# Patient Record
Sex: Female | Born: 1990 | Race: White | Hispanic: No | Marital: Married | Smoking: Never smoker
Health system: Southern US, Community
[De-identification: ages and names within clinical notes are randomized; demographics above are authoritative.]

## PROBLEM LIST (undated history)

## (undated) DIAGNOSIS — N390 Urinary tract infection, site not specified: Secondary | ICD-10-CM

## (undated) HISTORY — PX: WISDOM TOOTH EXTRACTION: SHX21

## (undated) HISTORY — DX: Urinary tract infection, site not specified: N39.0

---

## 2014-02-10 ENCOUNTER — Ambulatory Visit: Payer: Self-pay | Admitting: Family Medicine

## 2014-02-19 ENCOUNTER — Ambulatory Visit (INDEPENDENT_AMBULATORY_CARE_PROVIDER_SITE_OTHER): Payer: 59 | Admitting: Family Medicine

## 2014-02-19 ENCOUNTER — Encounter: Payer: Self-pay | Admitting: Family Medicine

## 2014-02-19 VITALS — BP 121/83 | HR 71 | Temp 98.3°F | Ht 69.0 in | Wt 130.0 lb

## 2014-02-19 DIAGNOSIS — Z30011 Encounter for initial prescription of contraceptive pills: Secondary | ICD-10-CM

## 2014-02-19 DIAGNOSIS — Z309 Encounter for contraceptive management, unspecified: Secondary | ICD-10-CM | POA: Insufficient documentation

## 2014-02-19 DIAGNOSIS — Z3009 Encounter for other general counseling and advice on contraception: Secondary | ICD-10-CM

## 2014-02-19 MED ORDER — NORGESTIMATE-ETH ESTRADIOL 0.25-35 MG-MCG PO TABS: 1.0000 | ORAL_TABLET | Freq: Every day | ORAL | Status: DC

## 2014-02-19 NOTE — Progress Notes (Signed)
   Subjective:    Patient ID: Joy Nichols, female    DOB: 07/12/1991, 23 y.o.   MRN: [id number]  HPI Joy Nichols is a 23 y.o. female presents to [facility] for establishment of care.   Pt has no complaints today. 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 She went to school in [place] and is from [place].  Well women: Patient's last menstrual period was 01/29/2014.  her menstrual cycles are approximately every 5 weeks. They last 4 days, with one day being heavy. She currently is not on any birth control methods but would like to restart the pill. She has taken Sprintec in the past. She's not currently sexually active. She prefers female partners. No family history of breast or colon cancer. Last Pap in 2013, Pap was normal.   Health maintenance: Last Pap 2013, it was normal. Flu shot given in 2014. Tdap. Given in 2008. Hepatitis B series in 2003. Hepatitis B titer and 2013 was positive. History of chickenpox in 79.Varicella titer in 2013 positive. MMR in [place].  PPDs have been negative up to this year. Menactra given in 2007.   Social: Patient is a gravida 0 para 0. 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 She is single. She exercises regularly at least 3 times a week. She denies tobacco, recreational drug use or overuse of alcohol. She does drink socially. She is safe in her relationships at home. She has no depression risks.  Past medical history of frequent UTIs. Nonsmoker. Family history updated. No known allergies.  Review of Systems Per HPI    Objective:   Physical Exam BP 121/83  Pulse 71  Temp(Src) 98.3 F (36.8 C) (Oral)  Ht $R'5\' 9"'LN$  (1.753 m)  Wt 130 lb (58.968 kg)  BMI 19.19 kg/m2  LMP 01/29/2014 Gen: extremely pleasant, Caucasian thin female. Well-developed, well-nourished, no acute distress, nontoxic in appearance HEENT: AT. [place]. Bilateral TM visualized and normal in appearance. Bilateral eyes without injections or icterus. MMM.  Bilateral nares  without erythema or swelling. Throat without erythema or exudates.  CV: RRR , no murmur appreciated. Chest: CTAB, no wheeze or crackles Abd: Soft.  thin. NTND. BS  present. no Masses palpated.  Ext: No erythema.  No edema.  2/4 posterior tibialis pulse and radial pulse. Skin:  no rashes, purpura or petechiae.  Neuro:  Normal gait. PERLA. EOMi. Alert.  cranial nerves II through XII grossly intact. DTRs equal bilaterally. Psych:  normal affect, dress, demeanor and mood. Normal speech.        Assessment & Plan:

## 2014-02-19 NOTE — Patient Instructions (Signed)
It was a pleasure meeting you today. Please make an appointment with her to have your Pap completed. If you need anything do not hesitate to call to make an appointment  Pap Test A Pap test is a procedure done in a clinic office to evaluate cells that are on the surface of the cervix. The cervix is the lower portion of the uterus and upper portion of the vagina. For some women, the cervical region has the potential to form cancer. With consistent evaluations by your caregiver, this type of cancer can be prevented.  If a Pap test is abnormal, it is most often a result of a previous exposure to human papillomavirus (HPV). HPV is a virus that can infect the cells of the cervix and cause dysplasia. Dysplasia is where the cells no longer look normal. If a woman has been diagnosed with high-grade or severe dysplasia, they are at higher risk of developing cervical cancer. People diagnosed with low-grade dysplasia should still be seen by their caregiver because there is a small chance that low-grade dysplasia could develop into cancer.  LET YOUR CAREGIVER KNOW ABOUT:  Recent sexually transmitted infection (STI) you have had.  Any new sex partners you have had.  History of previous abnormal Pap tests results.  History of previous cervical procedures you have had (colposcopy, biopsy, loop electrosurgical excision procedure [LEEP]).  Concerns you have had regarding unusual vaginal discharge.  History of pelvic pain.  Your use of birth control. BEFORE THE PROCEDURE  Ask your caregiver when to schedule your Pap test. It is best not to be on your period if your caregiver uses a wooden spatula to collect cells or applies cells to a glass slide. Newer techniques are not so sensitive to the timing of a menstrual cycle.  Do not douche or have sexual intercourse for 24 hours before the test.   Do not use vaginal creams or tampons for 24 hours before the test.   Empty your bladder just before the test to  lessen any discomfort.  PROCEDURE You will lie on an exam table with your feet in stirrups. A warm metal or plastic instrument (speculum) is placed in your vagina. This instrument allows your caregiver to see the inside of your vagina and look at your cervix. A small, plastic brush or wooden spatula is then used to collect cervical cells. These cells are placed in a lab specimen container. The cells are looked at under a microscope. A specialist will determine if the cells are normal.  AFTER THE PROCEDURE Make sure to get your test results.If your results come back abnormal, you may need further testing.  Document Released: 10/01/2002 Document Revised: 10/03/2011 Document Reviewed: 07/07/2011 [facility] Patient Information 2015 [employer], [place]. This information is not intended to replace advice given to you by your health care provider. Make sure you discuss any questions you have with your health care provider.

## 2014-04-07 ENCOUNTER — Ambulatory Visit: Payer: 59 | Admitting: Family Medicine

## 2015-01-12 ENCOUNTER — Ambulatory Visit (INDEPENDENT_AMBULATORY_CARE_PROVIDER_SITE_OTHER): Payer: 59 | Admitting: Internal Medicine

## 2015-01-12 DIAGNOSIS — Z23 Encounter for immunization: Secondary | ICD-10-CM

## 2015-01-12 DIAGNOSIS — Z9189 Other specified personal risk factors, not elsewhere classified: Secondary | ICD-10-CM | POA: Diagnosis not present

## 2015-01-12 MED ORDER — CIPROFLOXACIN HCL 500 MG PO TABS: 500.0000 mg | ORAL_TABLET | Freq: Two times a day (BID) | ORAL | Status: DC

## 2015-01-12 NOTE — Progress Notes (Signed)
  RFV: 2 wk in [place] and 1 wk in [place] Subjective:    Patient ID: Joy Nichols, female    DOB: 16-May-1991, 24 y.o.   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 She had flu vaccine in the last year, as well as pre-travel vaccination in may 2014 for [place] trip which inc hep a, typhoid inj. She states that she had tetanus roughly 8 yr ago.uptodate on vaccines for work  Current Outpatient Prescriptions on File Prior to Visit  Medication Sig Dispense Refill  . norgestimate-ethinyl estradiol (SPRINTEC 28) 0.25-35 MG-MCG tablet Take 1 tablet by mouth daily. 1 Package 11   No current facility-administered medications on file prior to visit.   No Known Allergies  Past travel: [place], [place], [place], [place], [place], [place], [place], [place], [place], [place], [place], [place], [place], [place], [place]  Review of Systems     Objective:   Physical Exam        Assessment & Plan:  Pre travel advice on traveler's diarrhea and mosquito bite prevention  Traveler diarrhea = gave rx for cipro  Travel vaccinations = will give hep A #2 today, recommend that she get typhoid next year

## 2015-03-05 ENCOUNTER — Other Ambulatory Visit: Payer: Self-pay | Admitting: *Deleted

## 2015-03-05 DIAGNOSIS — Z30011 Encounter for initial prescription of contraceptive pills: Secondary | ICD-10-CM

## 2015-03-05 MED ORDER — NORGESTIMATE-ETH ESTRADIOL 0.25-35 MG-MCG PO TABS: 1.0000 | ORAL_TABLET | Freq: Every day | ORAL | Status: DC

## 2015-04-12 ENCOUNTER — Telehealth: Payer: 59 | Admitting: Family

## 2015-04-12 DIAGNOSIS — N3000 Acute cystitis without hematuria: Secondary | ICD-10-CM

## 2015-04-12 MED ORDER — NITROFURANTOIN MONOHYD MACRO 100 MG PO CAPS: 100.0000 mg | ORAL_CAPSULE | Freq: Two times a day (BID) | ORAL | Status: DC

## 2015-04-12 NOTE — Progress Notes (Signed)

## 2015-04-29 ENCOUNTER — Encounter: Payer: Self-pay | Admitting: Family Medicine

## 2015-04-29 ENCOUNTER — Ambulatory Visit (INDEPENDENT_AMBULATORY_CARE_PROVIDER_SITE_OTHER): Payer: 59 | Admitting: Family Medicine

## 2015-04-29 VITALS — BP 128/85 | HR 70 | Temp 98.1°F | Ht 69.0 in | Wt 127.0 lb

## 2015-04-29 DIAGNOSIS — R5383 Other fatigue: Secondary | ICD-10-CM | POA: Diagnosis not present

## 2015-04-29 DIAGNOSIS — R6889 Other general symptoms and signs: Secondary | ICD-10-CM

## 2015-04-29 DIAGNOSIS — Z0001 Encounter for general adult medical examination with abnormal findings: Secondary | ICD-10-CM | POA: Diagnosis not present

## 2015-04-29 DIAGNOSIS — Z0000 Encounter for general adult medical examination without abnormal findings: Secondary | ICD-10-CM | POA: Diagnosis not present

## 2015-04-29 DIAGNOSIS — Z8342 Family history of familial hypercholesterolemia: Secondary | ICD-10-CM | POA: Diagnosis not present

## 2015-04-29 LAB — LIPID PANEL
CHOL/HDL RATIO: 2.9 ratio (ref <=5.0)
CHOLESTEROL: 168 mg/dL (ref 125–200)
HDL: 58 mg/dL (ref >=46)
LDL Cholesterol: 88 mg/dL (ref <130)
Triglycerides: 110 mg/dL (ref <150)
VLDL: 22 mg/dL (ref <30)

## 2015-04-29 LAB — CBC
HCT: 38.1 % (ref 36.0–46.0)
Hemoglobin: 13.3 g/dL (ref 12.0–15.0)
MCH: 32.4 pg (ref 26.0–34.0)
MCHC: 34.9 g/dL (ref 30.0–36.0)
MCV: 92.7 fL (ref 78.0–100.0)
MPV: 9 fL (ref 8.6–12.4)
PLATELETS: 241 10*3/uL (ref 150–400)
RBC: 4.11 MIL/uL (ref 3.87–5.11)
RDW: 14 % (ref 11.5–15.5)
WBC: 3.9 10*3/uL — ABNORMAL LOW (ref 4.0–10.5)

## 2015-04-29 LAB — BASIC METABOLIC PANEL WITH GFR
BUN: 12 mg/dL (ref 7–25)
CHLORIDE: 107 mmol/L (ref 98–110)
CO2: 25 mmol/L (ref 20–31)
Calcium: 9.4 mg/dL (ref 8.6–10.2)
Creat: 0.72 mg/dL (ref 0.50–1.10)
GFR, Est Non African American: >89 mL/min (ref >=60)
Glucose, Bld: 90 mg/dL (ref 65–99)
POTASSIUM: 4.1 mmol/L (ref 3.5–5.3)
SODIUM: 139 mmol/L (ref 135–146)

## 2015-04-29 LAB — TSH: TSH: 2.689 u[IU]/mL (ref 0.350–4.500)

## 2015-04-29 NOTE — Assessment & Plan Note (Signed)
Due for pap, has OBGYN appointment for this next week. Already had flu shot. Family history of high cholesterol. Check lipid panel today.

## 2015-04-29 NOTE — Assessment & Plan Note (Signed)
Vague ongoing symptoms, will check BMP, CBC, TSH, Vit D.

## 2015-04-29 NOTE — Progress Notes (Signed)
   Subjective:    Patient ID: Joy Nichols, female    DOB: 30-Jun-1991, 24 y.o.   MRN: [id number]  HPI  CC: annual physical  # Annual physical:  Due for pap smear, has OBGYN appointment next week for this  Got flu shot last week ([employer] employee)  Family history of high cholesterol ROS: no heartburn, no HA, +UTIs in the past treated with antibiotics.   # Fatigue  Noticed more tired lately ROS: no weight changes, +cold insensitivity, +easy bruisability  Social Hx: never smoker, drinks socially about once a week (no binge drinking)  Review of Systems   See HPI for ROS.   Past medical history, surgical, family, and social history reviewed and updated in the EMR as appropriate. Objective:  BP 128/85 mmHg  Pulse 70  Temp(Src) 98.1 F (36.7 C) (Oral)  Ht  (1.753 m)  Wt 127 lb (57.607 kg)  BMI 18.75 kg/m2  LMP 04/27/2015 (Exact Date) Vitals and nursing note reviewed  General: NAD HEENT: PERRL, EOMI. Normal nares. No oropharyngeal lesions Neck: [place] CV: RRR, normal s1s2, no murmurs/rub/gallop, 2+ PT pulses bilaterally Resp: clear to auscultation bilaterally, normal effort Abdomen: thin, soft, nontender, nondistended, normal bowel sounds Ext: no edema. Normal muscle bulk/tone. Neuro: alert and oriented, no focal deficits. Normal gait Skin: scattered bruises noted on lower legs. Psych: mood normal, affect congruent. Normal thought content and speech.  Assessment & Plan:  Healthcare maintenance Due for pap, has OBGYN appointment for this next week. Already had flu shot. Family history of high cholesterol. Check lipid panel today.  Fatigue Vague ongoing symptoms, will check BMP, CBC, TSH, Vit D.

## 2015-04-30 ENCOUNTER — Encounter: Payer: Self-pay | Admitting: Family Medicine

## 2015-04-30 LAB — VITAMIN D 25 HYDROXY (VIT D DEFICIENCY, FRACTURES): Vit D, 25-Hydroxy: 39 ng/mL (ref 30–100)

## 2015-09-29 DIAGNOSIS — R300 Dysuria: Secondary | ICD-10-CM | POA: Diagnosis not present

## 2015-10-05 DIAGNOSIS — H5213 Myopia, bilateral: Secondary | ICD-10-CM | POA: Diagnosis not present

## 2016-01-19 ENCOUNTER — Other Ambulatory Visit: Payer: Self-pay | Admitting: *Deleted

## 2016-01-19 DIAGNOSIS — Z30011 Encounter for initial prescription of contraceptive pills: Secondary | ICD-10-CM

## 2016-01-19 MED ORDER — NORGESTIMATE-ETH ESTRADIOL 0.25-35 MG-MCG PO TABS: 1.0000 | ORAL_TABLET | Freq: Every day | ORAL | Status: DC

## 2016-08-17 DIAGNOSIS — Z0000 Encounter for general adult medical examination without abnormal findings: Secondary | ICD-10-CM | POA: Diagnosis not present

## 2016-08-17 DIAGNOSIS — Z01419 Encounter for gynecological examination (general) (routine) without abnormal findings: Secondary | ICD-10-CM | POA: Diagnosis not present

## 2016-08-17 DIAGNOSIS — Z13220 Encounter for screening for lipoid disorders: Secondary | ICD-10-CM | POA: Diagnosis not present

## 2016-08-17 DIAGNOSIS — Z1329 Encounter for screening for other suspected endocrine disorder: Secondary | ICD-10-CM | POA: Diagnosis not present

## 2016-08-17 DIAGNOSIS — Z681 Body mass index (BMI) 19 or less, adult: Secondary | ICD-10-CM | POA: Diagnosis not present

## 2016-08-17 DIAGNOSIS — Z130 Encounter for screening for diseases of the blood and blood-forming organs and certain disorders involving the immune mechanism: Secondary | ICD-10-CM | POA: Diagnosis not present

## 2016-10-05 DIAGNOSIS — H5213 Myopia, bilateral: Secondary | ICD-10-CM | POA: Diagnosis not present

## 2016-10-28 ENCOUNTER — Ambulatory Visit (INDEPENDENT_AMBULATORY_CARE_PROVIDER_SITE_OTHER): Payer: 59 | Admitting: Family Medicine

## 2016-10-28 ENCOUNTER — Encounter: Payer: Self-pay | Admitting: Family Medicine

## 2016-10-28 VITALS — BP 110/78 | HR 74 | Temp 97.9°F | Ht 69.0 in | Wt 122.0 lb

## 2016-10-28 DIAGNOSIS — J020 Streptococcal pharyngitis: Secondary | ICD-10-CM

## 2016-10-28 DIAGNOSIS — J029 Acute pharyngitis, unspecified: Secondary | ICD-10-CM

## 2016-10-28 LAB — POCT RAPID STREP A (OFFICE): RAPID STREP A SCREEN: POSITIVE — AB

## 2016-10-28 MED ORDER — PENICILLIN G BENZATHINE 1200000 UNIT/2ML IM SUSP
1.2000 10*6.[IU] | Freq: Once | INTRAMUSCULAR | Status: AC
  Administered 2016-10-28: 1.2 10*6.[IU] via INTRAMUSCULAR

## 2016-10-28 MED ORDER — AMOXICILLIN 500 MG PO CAPS: 500.0000 mg | ORAL_CAPSULE | Freq: Three times a day (TID) | ORAL | 0 refills | Status: DC

## 2016-10-28 NOTE — Patient Instructions (Signed)
You were given an injection of penicillin for the strep throat.  Continue warm fluids and lozenges as needed.  If you note worsening symptoms such as difficulty handling secretions, worsening pain with swallowing, or high fevers, please seek care immediately.   Strep Throat Strep throat is a bacterial infection of the throat. Your health care provider may call the infection tonsillitis or pharyngitis, depending on whether there is swelling in the tonsils or at the back of the throat. Strep throat is most common during the cold months of the year in children who are 11-7 years of age, but it can happen during any season in people of any age. This infection is spread from person to person (contagious) through coughing, sneezing, or close contact. What are the causes? Strep throat is caused by the bacteria called Streptococcus pyogenes. What increases the risk? This condition is more likely to develop in:  People who spend time in crowded places where the infection can spread easily.  People who have close contact with someone who has strep throat. What are the signs or symptoms? Symptoms of this condition include:  Fever or chills.  Redness, swelling, or pain in the tonsils or throat.  Pain or difficulty when swallowing.  White or yellow spots on the tonsils or throat.  Swollen, tender glands in the neck or under the jaw.  Red rash all over the body (rare). How is this diagnosed? This condition is diagnosed by performing a rapid strep test or by taking a swab of your throat (throat culture test). Results from a rapid strep test are usually ready in a few minutes, but throat culture test results are available after one or two days. How is this treated? This condition is treated with antibiotic medicine. Follow these instructions at home: Medicines   Take over-the-counter and prescription medicines only as told by your health care provider.  Take your antibiotic as told by your  health care provider. Do not stop taking the antibiotic even if you start to feel better.  Have family members who also have a sore throat or fever tested for strep throat. They may need antibiotics if they have the strep infection. Eating and drinking   Do not share food, drinking cups, or personal items that could cause the infection to spread to other people.  If swallowing is difficult, try eating soft foods until your sore throat feels better.  Drink enough fluid to keep your urine clear or pale yellow. General instructions   Gargle with a salt-water mixture 3-4 times per day or as needed. To make a salt-water mixture, completely dissolve -1 tsp of salt in 1 cup of warm water.  Make sure that all household members wash their hands well.  Get plenty of rest.  Stay home from school or work until you have been taking antibiotics for 24 hours.  Keep all follow-up visits as told by your health care provider. This is important. Contact a health care provider if:  The glands in your neck continue to get bigger.  You develop a rash, cough, or earache.  You cough up a thick liquid that is green, yellow-brown, or bloody.  You have pain or discomfort that does not get better with medicine.  Your problems seem to be getting worse rather than better.  You have a fever. Get help right away if:  You have new symptoms, such as vomiting, severe headache, stiff or painful neck, chest pain, or shortness of breath.  You have severe throat  pain, drooling, or changes in your voice.  You have swelling of the neck, or the skin on the neck becomes red and tender.  You have signs of dehydration, such as fatigue, dry mouth, and decreased urination.  You become increasingly sleepy, or you cannot wake up completely.  Your joints become red or painful. This information is not intended to replace advice given to you by your health care provider. Make sure you discuss any questions you have with  your health care provider. Document Released: 07/08/2000 Document Revised: 03/09/2016 Document Reviewed: 11/03/2014 Elsevier Interactive Patient Education  2017 [employer].

## 2016-10-28 NOTE — Progress Notes (Signed)
Subjective: CC: sore throat HPI: Patient is a 26 y.o. female presenting to clinic today for sore throat.   Sore throat started yesterday. She had significant pain with swallowing, but it improved with warm tea. She noted some white spots on heart tonsils last night. She also noted some body aches last night that resolved this morning.  She states that this morning when she was her tonsils, she noted what appeared to be a "blood blister" on her right tonsil. She is drinking better today, she notes that her sore throat improves with warm liquids. Her sore throat is worsened during the evening time. She denies any difficulty with handling her secretions. She denies fever or chills. No cough, nasal congestion, rhinorrhea, rash, otalgias, known sick contacts.  As the patient is a going to [place] in 2 days.  Social History: pharmacist at the hospital   ROS: All other systems reviewed and are negative.  Past Medical History Patient Active Problem List   Diagnosis Date Noted  . Strep pharyngitis 10/28/2016  . Healthcare maintenance 04/29/2015  . Fatigue 04/29/2015  . Contraception management 02/19/2014    Medications- reviewed and updated Current Outpatient Prescriptions  Medication Sig Dispense Refill  . amoxicillin (AMOXIL) 500 MG capsule Take 1 capsule (500 mg total) by mouth 3 (three) times daily. 20 capsule 0  . ciprofloxacin (CIPRO) 500 MG tablet Take 1 tablet (500 mg total) by mouth 2 (two) times daily. If you have 3 loose stools / 24hr. Can stop taking if diarrhea resolves 10 tablet 0  . nitrofurantoin, macrocrystal-monohydrate, (MACROBID) 100 MG capsule Take 1 capsule (100 mg total) by mouth 2 (two) times daily. 10 capsule 0  . norgestimate-ethinyl estradiol (SPRINTEC 28) 0.25-35 MG-MCG tablet Take 1 tablet by mouth daily. 1 Package 11   No current facility-administered medications for this visit.     Objective: Office vital signs reviewed. BP 110/78   Pulse 74   Temp 97.9  F (36.6 C) (Oral)   Ht  (1.753 m)   Wt 122 lb (55.3 kg)   LMP 10/07/2016   SpO2 99%   BMI 18.02 kg/m    Physical Examination:  General: Awake, alert, well- nourished, NAD, Pleasant ENMT:  TMs intact, normal light reflex, no erythema, no bulging. Nasal turbinates moist. MMM, Swelling and erythema noted of the tonsils, R>L, bright red area on right tonsil with ulceration noted. Uvula midline. No evidence of abscess.  Eyes: Conjunctiva non-injected. PERRL.  Cardio: RRR, no m/r/g noted. No thrill Pulm: No increased WOB.  CTAB, without wheezes, rhonchi or crackles noted.   Rapid Strep A positive   Assessment/Plan: Strep pharyngitis Exam and history consistent with strep pharyngitis. Rapid strep A positive. Patient is nontoxic on exam. She is noted to have an ulceration on her right tonsil, but no evidence of A retropharyngeal abscess. Additionally, no difficulty handling secretions. -Given Bicillin 1.2 million units IM in clinic today -Patient states that her parents noted as a child she often had to be re-treated for strep and she's concerned as she's going to [place] in 2 days. She requests a Rx that she can fill if she needs it in [place].  She seems very appropriate (pharmacist), therefore I gave her a Rx for amoxicillin  BID x 10 days to fill just in case. We discuss that PCN takes some time to be effective. Also discuss reasons she needs to seek care immediately such as worsening pain, difficulty swallowing, difficulty handling secretions.  - Should continue to  f/u patient to ensure ulceration on R tonsils resolves with resolution of infection    Orders Placed This Encounter  Procedures  . POCT rapid strep A    Meds ordered this encounter  Medications  . penicillin g benzathine (BICILLIN LA) 1200000 UNIT/2ML injection 1.2 Million Units  . amoxicillin (AMOXIL) 500 MG capsule    Sig: Take 1 capsule (500 mg total) by mouth 3 (three) times daily.    Dispense:  20 capsule     Refill:  0    Joanna Puff PGY-3, [facility]

## 2016-10-28 NOTE — Assessment & Plan Note (Addendum)
Exam and history consistent with strep pharyngitis. Rapid strep A positive. Patient is nontoxic on exam. She is noted to have an ulceration on her right tonsil, but no evidence of A retropharyngeal abscess. Additionally, no difficulty handling secretions. -Given Bicillin 1.2 million units IM in clinic today -Patient states that her parents noted as a child she often had to be re-treated for strep and she's concerned as she's going to [place] in 2 days. She requests a Rx that she can fill if she needs it in [place].  She seems very appropriate (pharmacist), therefore I gave her a Rx for amoxicillin  BID x 10 days to fill just in case. We discuss that PCN takes some time to be effective. Also discuss reasons she needs to seek care immediately such as worsening pain, difficulty swallowing, difficulty handling secretions.  - Should continue to f/u patient to ensure ulceration on R tonsils resolves with resolution of infection

## 2017-10-04 DIAGNOSIS — Z0000 Encounter for general adult medical examination without abnormal findings: Secondary | ICD-10-CM | POA: Diagnosis not present

## 2017-10-04 DIAGNOSIS — Z1329 Encounter for screening for other suspected endocrine disorder: Secondary | ICD-10-CM | POA: Diagnosis not present

## 2017-10-04 DIAGNOSIS — Z131 Encounter for screening for diabetes mellitus: Secondary | ICD-10-CM | POA: Diagnosis not present

## 2017-10-04 DIAGNOSIS — Z13220 Encounter for screening for lipoid disorders: Secondary | ICD-10-CM | POA: Diagnosis not present

## 2017-10-04 DIAGNOSIS — Z01419 Encounter for gynecological examination (general) (routine) without abnormal findings: Secondary | ICD-10-CM | POA: Diagnosis not present

## 2017-10-04 DIAGNOSIS — Z681 Body mass index (BMI) 19 or less, adult: Secondary | ICD-10-CM | POA: Diagnosis not present

## 2017-10-04 DIAGNOSIS — Z130 Encounter for screening for diseases of the blood and blood-forming organs and certain disorders involving the immune mechanism: Secondary | ICD-10-CM | POA: Diagnosis not present

## 2017-10-04 LAB — HM PAP SMEAR: HM Pap smear: NEGATIVE

## 2018-01-31 DIAGNOSIS — H5213 Myopia, bilateral: Secondary | ICD-10-CM | POA: Diagnosis not present

## 2018-08-14 MED FILL — PREVIDENT 5000 BOOSTER PLUS: 1.1 | 30 days supply | Qty: 100 | Fill #0

## 2018-08-14 MED FILL — FEMYNOR 0.25-35 MG-MCG TABS: 0.25-35 | 84 days supply | Qty: 112 | Fill #0

## 2018-09-21 ENCOUNTER — Ambulatory Visit: Payer: 59 | Admitting: Family Medicine

## 2019-02-07 MED FILL — FEMYNOR 0.25-35 MG-MCG TABS: 0.25-35 | 84 days supply | Qty: 112 | Fill #0

## 2019-02-18 DIAGNOSIS — H5213 Myopia, bilateral: Secondary | ICD-10-CM | POA: Diagnosis not present

## 2019-03-06 DIAGNOSIS — Z1329 Encounter for screening for other suspected endocrine disorder: Secondary | ICD-10-CM | POA: Diagnosis not present

## 2019-03-06 DIAGNOSIS — Z13220 Encounter for screening for lipoid disorders: Secondary | ICD-10-CM | POA: Diagnosis not present

## 2019-03-06 DIAGNOSIS — Z01419 Encounter for gynecological examination (general) (routine) without abnormal findings: Secondary | ICD-10-CM | POA: Diagnosis not present

## 2019-03-06 DIAGNOSIS — Z681 Body mass index (BMI) 19 or less, adult: Secondary | ICD-10-CM | POA: Diagnosis not present

## 2019-03-06 DIAGNOSIS — Z131 Encounter for screening for diabetes mellitus: Secondary | ICD-10-CM | POA: Diagnosis not present

## 2019-03-06 DIAGNOSIS — Z20828 Contact with and (suspected) exposure to other viral communicable diseases: Secondary | ICD-10-CM | POA: Diagnosis not present

## 2019-03-06 DIAGNOSIS — Z0000 Encounter for general adult medical examination without abnormal findings: Secondary | ICD-10-CM | POA: Diagnosis not present

## 2019-03-06 DIAGNOSIS — Z130 Encounter for screening for diseases of the blood and blood-forming organs and certain disorders involving the immune mechanism: Secondary | ICD-10-CM | POA: Diagnosis not present

## 2019-03-06 LAB — HEMOGLOBIN A1C: Hemoglobin A1C: 5.1

## 2019-03-06 MED FILL — NITROFURANTOIN MONO-MCR 100: 100 | 30 days supply | Qty: 30 | Fill #0

## 2019-03-07 ENCOUNTER — Encounter: Payer: Self-pay | Admitting: Family Medicine

## 2019-03-07 LAB — LIPID PANEL
Cholesterol: 167 (ref 0–200)
HDL: 59 (ref 35–70)
LDL Cholesterol: 91
Triglycerides: 87 (ref 40–160)

## 2019-05-27 MED FILL — FEMYNOR 0.25-35 MG-MCG TABS: 0.25-35 | 84 days supply | Qty: 112 | Fill #0

## 2019-07-01 DIAGNOSIS — Z20828 Contact with and (suspected) exposure to other viral communicable diseases: Secondary | ICD-10-CM | POA: Diagnosis not present

## 2019-08-22 MED FILL — FEMYNOR 0.25-35 MG-MCG TABS: 0.25-35 | 84 days supply | Qty: 112 | Fill #1

## 2019-10-17 ENCOUNTER — Other Ambulatory Visit: Payer: Self-pay

## 2019-10-18 ENCOUNTER — Ambulatory Visit (INDEPENDENT_AMBULATORY_CARE_PROVIDER_SITE_OTHER): Payer: No Typology Code available for payment source | Admitting: Family Medicine

## 2019-10-18 ENCOUNTER — Encounter: Payer: Self-pay | Admitting: Family Medicine

## 2019-10-18 VITALS — BP 122/80 | HR 83 | Temp 98.2°F | Ht 69.0 in | Wt 122.6 lb

## 2019-10-18 DIAGNOSIS — Z0000 Encounter for general adult medical examination without abnormal findings: Secondary | ICD-10-CM | POA: Diagnosis not present

## 2019-10-18 DIAGNOSIS — D72819 Decreased white blood cell count, unspecified: Secondary | ICD-10-CM | POA: Diagnosis not present

## 2019-10-18 DIAGNOSIS — R17 Unspecified jaundice: Secondary | ICD-10-CM | POA: Diagnosis not present

## 2019-10-18 LAB — CBC WITH DIFFERENTIAL/PLATELET
Basophils Absolute: 0 10*3/uL (ref 0.0–0.1)
Basophils Relative: 0.6 % (ref 0.0–3.0)
Eosinophils Absolute: 0 10*3/uL (ref 0.0–0.7)
Eosinophils Relative: 0.6 % (ref 0.0–5.0)
HCT: 36.9 % (ref 36.0–46.0)
Hemoglobin: 12.8 g/dL (ref 12.0–15.0)
Lymphocytes Relative: 49.4 % — ABNORMAL HIGH (ref 12.0–46.0)
Lymphs Abs: 3.1 10*3/uL (ref 0.7–4.0)
MCHC: 34.8 g/dL (ref 30.0–36.0)
MCV: 93.8 fl (ref 78.0–100.0)
Monocytes Absolute: 0.5 10*3/uL (ref 0.1–1.0)
Monocytes Relative: 8 % (ref 3.0–12.0)
Neutro Abs: 2.6 10*3/uL (ref 1.4–7.7)
Neutrophils Relative %: 41.4 % — ABNORMAL LOW (ref 43.0–77.0)
Platelets: 221 10*3/uL (ref 150.0–400.0)
RBC: 3.94 Mil/uL (ref 3.87–5.11)
RDW: 13.9 % (ref 11.5–15.5)
WBC: 6.3 10*3/uL (ref 4.0–10.5)

## 2019-10-18 LAB — COMPREHENSIVE METABOLIC PANEL
ALT: 13 U/L (ref 0–35)
AST: 14 U/L (ref 0–37)
Albumin: 4.4 g/dL (ref 3.5–5.2)
Alkaline Phosphatase: 34 U/L — ABNORMAL LOW (ref 39–117)
BUN: 11 mg/dL (ref 6–23)
CO2: 26 mEq/L (ref 19–32)
Calcium: 9.2 mg/dL (ref 8.4–10.5)
Chloride: 102 mEq/L (ref 96–112)
Creatinine, Ser: 0.56 mg/dL (ref 0.40–1.20)
GFR: 126.87 mL/min (ref >60.00)
Glucose, Bld: 78 mg/dL (ref 70–99)
Potassium: 3.6 mEq/L (ref 3.5–5.1)
Sodium: 136 mEq/L (ref 135–145)
Total Bilirubin: 1.8 mg/dL — ABNORMAL HIGH (ref 0.2–1.2)
Total Protein: 7.3 g/dL (ref 6.0–8.3)

## 2019-10-18 NOTE — Addendum Note (Signed)
Addended by: Bonnye Fava on: 10/18/2019 02:01 PM   Modules accepted: Orders

## 2019-10-18 NOTE — Progress Notes (Signed)
Joy Nichols DOB: 1991-03-09 Encounter date: 10/18/2019  This is a 29 y.o. female who presents to establish care. Chief Complaint  Patient presents with  . 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   No specific concerns today.   UTIs: hasn't had one in last few years. Has had about 10 in life.   Follows with Dr. Pamala Hurry for gyn needs.Brought bloodwork with her from last year. Had full panel done. Bili slightly elevated. Hasn't has this in the past.   Weight has been pretty stable; states that this is where her baseline is (was higher in college, but returned to this after first year)   Past Medical History:  Diagnosis Date  . Frequent UTI    Past Surgical History:  Procedure Laterality Date  . WISDOM TOOTH EXTRACTION     No Known Allergies Current Meds  Medication Sig  . norgestimate-ethinyl estradiol (SPRINTEC 28) 0.25-35 MG-MCG tablet Take 1 tablet by mouth daily.   Social History   Tobacco Use  . Smoking status: Never Smoker  . Smokeless tobacco: Never Used  Substance Use Topics  . Alcohol use: Yes    Alcohol/week: 1.0 standard drinks    Types: 1 Shots of liquor per week   Family History  Problem Relation Age of Onset  . Hypertension Paternal Aunt   . Heart attack Maternal Grandmother 88  . Diverticulitis Maternal Grandmother   . Obesity Maternal Grandmother   . Diabetes Maternal Grandmother   . Rheum arthritis Paternal Grandmother   . Prostate cancer Father 52  . Prostate cancer Paternal Grandfather   . Tremor Mother   . Other Mother        palpitations  . Stroke Maternal Grandfather 80     Review of Systems  Constitutional: Negative for activity change, appetite change, chills, fatigue, fever and unexpected weight change.  HENT: Negative for congestion, ear pain, hearing loss, sinus pressure, sinus pain, sore throat and trouble swallowing.   Eyes: Negative for pain and visual disturbance.   Respiratory: Negative for cough, chest tightness, shortness of breath and wheezing.   Cardiovascular: Negative for chest pain, palpitations and leg swelling.  Gastrointestinal: Negative for abdominal pain, blood in stool, constipation, diarrhea, nausea and vomiting.  Genitourinary: Negative for difficulty urinating and menstrual problem.  Musculoskeletal: Negative for arthralgias and back pain.       Rare hip pain; states legs are diff lengths. Doesn't bother her much.  Skin: Negative for rash.  Neurological: Negative for dizziness, weakness, numbness and headaches.  Hematological: Negative for adenopathy. Does not bruise/bleed easily.  Psychiatric/Behavioral: Negative for sleep disturbance and suicidal ideas. The patient is not nervous/anxious.     Objective:  BP 122/80 (BP Location: Left Arm, Patient Position: Sitting, Cuff Size: Normal)   Pulse 83   Temp 98.2 F (36.8 C) (Temporal)   Ht 5\' 9"  (1.753 m)   Wt 122 lb 9.6 oz (55.6 kg)   LMP 10/04/2019 (Approximate)   SpO2 99%   BMI 18.10 kg/m   Weight: 122 lb 9.6 oz (55.6 kg)   BP Readings from Last 3 Encounters:  10/18/19 122/80  10/28/16 110/78  04/29/15 128/85   Wt Readings from Last 3 Encounters:  10/18/19 122 lb 9.6 oz (55.6 kg)  10/28/16 122 lb (55.3 kg)  04/29/15 127 lb (57.6 kg)    Physical Exam Constitutional:      General: She is not in acute distress.  Appearance: She is well-developed.  HENT:     Head: Normocephalic and atraumatic.     Right Ear: External ear normal.     Left Ear: External ear normal.     Mouth/Throat:     Pharynx: No oropharyngeal exudate.  Eyes:     Conjunctiva/sclera: Conjunctivae normal.     Pupils: Pupils are equal, round, and reactive to light.  Neck:     Thyroid: No thyromegaly.  Cardiovascular:     Rate and Rhythm: Normal rate and regular rhythm.     Heart sounds: Normal heart sounds. No murmur. No friction rub. No gallop.   Pulmonary:     Effort: Pulmonary effort is  normal.     Breath sounds: Normal breath sounds.  Abdominal:     General: Bowel sounds are normal. There is no distension.     Palpations: Abdomen is soft. There is no mass.     Tenderness: There is no abdominal tenderness. There is no guarding.     Hernia: No hernia is present.  Musculoskeletal:        General: No tenderness or deformity. Normal range of motion.     Cervical back: Normal range of motion and neck R[place]y.  Lymphadenopathy:     Cervical: No cervical adenopathy.  Skin:    General: Skin is warm and dry.     Findings: No rash.  Neurological:     Mental Status: She is alert and oriented to person, place, and time.     Deep Tendon Reflexes: Reflexes normal.     Reflex Scores:      Tricep reflexes are 2+ on the right side and 2+ on the left side.      Bicep reflexes are 2+ on the right side and 2+ on the left side.      Brachioradialis reflexes are 2+ on the right side and 2+ on the left side.      Patellar reflexes are 2+ on the right side and 2+ on the left side. Psychiatric:        Speech: Speech normal.        Behavior: Behavior normal.        Thought Content: Thought content normal.     Assessment/Plan: 1. Preventative health care She is following regularly with gynecology; otherwise healthy..   2. Leukopenia, unspecified type Noted on previous bloodwork. Recheck for stability. - CBC with Differential/Platelet; Future  3. Elevated bilirubin Mild; suspect normal variant. - Comprehensive metabolic panel; Future  Return in about 1 year (around 10/17/2020).  Theodis Shove, MD

## 2019-10-20 ENCOUNTER — Other Ambulatory Visit: Payer: Self-pay | Admitting: Family Medicine

## 2019-10-20 DIAGNOSIS — R17 Unspecified jaundice: Secondary | ICD-10-CM

## 2019-10-22 ENCOUNTER — Telehealth: Payer: Self-pay | Admitting: Family Medicine

## 2019-10-22 ENCOUNTER — Encounter: Payer: Self-pay | Admitting: Family Medicine

## 2019-10-22 NOTE — Telephone Encounter (Signed)
See results note. 

## 2019-10-22 NOTE — Telephone Encounter (Signed)
Patient was returning Joy Nichols's call. I didn't see a message in regards to the call back.  Please advise

## 2019-10-28 ENCOUNTER — Telehealth: Payer: Self-pay | Admitting: Family Medicine

## 2019-10-28 NOTE — Telephone Encounter (Signed)
Dr Hassan Rowan reviewed the note attached to the FMLA forms which stated short term disability is requested for disability prior to potential pregnancy and stated this would need to be given to the pts OB/GYN.  I left a detailed message at the pts cell number with this info and forms were placed back at the front desk for her to pick up.

## 2019-10-28 NOTE — Telephone Encounter (Signed)
Pt came in and dropped off [employer]ability Form that need to be completed by the provider.  Upon completion pt would like to be called [phone] to pick up the form.  Form placed in providers folder for completion

## 2019-11-14 ENCOUNTER — Other Ambulatory Visit: Payer: Self-pay

## 2019-11-15 ENCOUNTER — Other Ambulatory Visit (INDEPENDENT_AMBULATORY_CARE_PROVIDER_SITE_OTHER): Payer: No Typology Code available for payment source

## 2019-11-15 DIAGNOSIS — R799 Abnormal finding of blood chemistry, unspecified: Secondary | ICD-10-CM

## 2019-11-15 DIAGNOSIS — R17 Unspecified jaundice: Secondary | ICD-10-CM

## 2019-11-15 LAB — CBC WITH DIFFERENTIAL/PLATELET
Basophils Absolute: 0 10*3/uL (ref 0.0–0.1)
Basophils Relative: 0.9 % (ref 0.0–3.0)
Eosinophils Absolute: 0.1 10*3/uL (ref 0.0–0.7)
Eosinophils Relative: 2.3 % (ref 0.0–5.0)
HCT: 37.7 % (ref 36.0–46.0)
Hemoglobin: 12.7 g/dL (ref 12.0–15.0)
Lymphocytes Relative: 62.2 % — ABNORMAL HIGH (ref 12.0–46.0)
Lymphs Abs: 2.2 10*3/uL (ref 0.7–4.0)
MCHC: 33.7 g/dL (ref 30.0–36.0)
MCV: 95.1 fl (ref 78.0–100.0)
Monocytes Absolute: 0.3 10*3/uL (ref 0.1–1.0)
Monocytes Relative: 9.4 % (ref 3.0–12.0)
Neutro Abs: 0.9 10*3/uL — ABNORMAL LOW (ref 1.4–7.7)
Neutrophils Relative %: 25.2 % — ABNORMAL LOW (ref 43.0–77.0)
Platelets: 223 10*3/uL (ref 150.0–400.0)
RBC: 3.96 Mil/uL (ref 3.87–5.11)
RDW: 13.4 % (ref 11.5–15.5)
WBC: 3.5 10*3/uL — ABNORMAL LOW (ref 4.0–10.5)

## 2019-11-15 LAB — HEPATIC FUNCTION PANEL
ALT: 11 U/L (ref 0–35)
AST: 15 U/L (ref 0–37)
Albumin: 4.1 g/dL (ref 3.5–5.2)
Alkaline Phosphatase: 34 U/L — ABNORMAL LOW (ref 39–117)
Bilirubin, Direct: 0.3 mg/dL (ref 0.0–0.3)
Total Bilirubin: 1.6 mg/dL — ABNORMAL HIGH (ref 0.2–1.2)
Total Protein: 6.7 g/dL (ref 6.0–8.3)

## 2019-11-16 LAB — BILIRUBIN, FRACTIONATED(TOT/DIR/INDIR)
Bilirubin, Direct: 0.3 mg/dL — ABNORMAL HIGH (ref 0.0–0.2)
Indirect Bilirubin: 1.3 mg/dL (calc) — ABNORMAL HIGH (ref 0.2–1.2)
Total Bilirubin: 1.6 mg/dL — ABNORMAL HIGH (ref 0.2–1.2)

## 2019-11-19 ENCOUNTER — Encounter: Payer: Self-pay | Admitting: Family Medicine

## 2019-11-20 ENCOUNTER — Other Ambulatory Visit: Payer: No Typology Code available for payment source

## 2019-11-20 ENCOUNTER — Telehealth: Payer: Self-pay | Admitting: Nurse Practitioner

## 2019-11-20 NOTE — Telephone Encounter (Signed)
Received anew hem referral from Dr. Hassan Rowan for abnl cbc. Pt has been cld and scheduled to see Lacie on 4/29 at1:45pm. Pt aware to arrive 15 minutes early.

## 2019-11-21 ENCOUNTER — Inpatient Hospital Stay: Payer: No Typology Code available for payment source | Attending: Nurse Practitioner | Admitting: Nurse Practitioner

## 2019-11-21 ENCOUNTER — Encounter: Payer: Self-pay | Admitting: Family Medicine

## 2019-11-21 ENCOUNTER — Other Ambulatory Visit: Payer: Self-pay

## 2019-11-21 ENCOUNTER — Encounter: Payer: Self-pay | Admitting: Nurse Practitioner

## 2019-11-21 ENCOUNTER — Inpatient Hospital Stay: Payer: No Typology Code available for payment source

## 2019-11-21 VITALS — BP 122/90 | HR 95 | Temp 98.9°F | Resp 20 | Ht 69.0 in | Wt 125.5 lb

## 2019-11-21 DIAGNOSIS — Z832 Family history of diseases of the blood and blood-forming organs and certain disorders involving the immune mechanism: Secondary | ICD-10-CM | POA: Insufficient documentation

## 2019-11-21 DIAGNOSIS — D709 Neutropenia, unspecified: Secondary | ICD-10-CM | POA: Diagnosis present

## 2019-11-21 LAB — CBC WITH DIFFERENTIAL (CANCER CENTER ONLY)
Abs Immature Granulocytes: 0.01 10*3/uL (ref 0.00–0.07)
Basophils Absolute: 0 10*3/uL (ref 0.0–0.1)
Basophils Relative: 1 %
Eosinophils Absolute: 0.1 10*3/uL (ref 0.0–0.5)
Eosinophils Relative: 1 %
HCT: 38.1 % (ref 36.0–46.0)
Hemoglobin: 13.1 g/dL (ref 12.0–15.0)
Immature Granulocytes: 0 %
Lymphocytes Relative: 48 %
Lymphs Abs: 2.1 10*3/uL (ref 0.7–4.0)
MCH: 31.8 pg (ref 26.0–34.0)
MCHC: 34.4 g/dL (ref 30.0–36.0)
MCV: 92.5 fL (ref 80.0–100.0)
Monocytes Absolute: 0.4 10*3/uL (ref 0.1–1.0)
Monocytes Relative: 10 %
Neutro Abs: 1.7 10*3/uL (ref 1.7–7.7)
Neutrophils Relative %: 40 %
Platelet Count: 237 10*3/uL (ref 150–400)
RBC: 4.12 MIL/uL (ref 3.87–5.11)
RDW: 13 % (ref 11.5–15.5)
WBC Count: 4.3 10*3/uL (ref 4.0–10.5)
nRBC: 0 % (ref 0.0–0.2)

## 2019-11-21 LAB — VITAMIN B12: Vitamin B-12: 198 pg/mL (ref 180–914)

## 2019-11-21 LAB — SAVE SMEAR(SSMR), FOR PROVIDER SLIDE REVIEW

## 2019-11-21 NOTE — Progress Notes (Addendum)
[place]  [phone] [phone]) [place] consult Note   Patient Care Team: Caren Macadam, MD as PCP - General (Family Medicine) Aloha Gell, MD as Consulting Physician (Obstetrics and Gynecology) 11/21/2019  CHIEF COMPLAINTS/PURPOSE OF CONSULTATION:  Abnormal CBC, referred by Dr. Ethlyn Gallery   HISTORY OF PRESENTING ILLNESS:  Joy Nichols 29 y.o. female is here because of low white blood cell count. She was found to have abnormal CBC from 04/28/2014 with first CBC in Epic, WBC 3.9. She did not have routine/annual PCP care until recently. Labs from Ob/gyn show WBC 2.9 with elevated lymph % 56.10 on 08/17/2016, and WBC 3.2 - 3.8 from 2019 - 2020 with similar lymphocytes. Repeat CBC on 3/26 21 showed normal WBC, and on 11/15/19 her WBC was 3.5 with ANC 0.9. unrelated, she has mild hyperbilirubinemia with normal direct bili and elevated indirect bili. She denies chronic or recurrent infections or allergies. Has been vaccinated for hepatitis due to her world travel. No risk factors for HIV. She eats a normal/regular diet, has not been checked for nutritional deficiencies. Drinks alcohol socially, denies tobacco or drug use. She has no history of autoimmune disorder such as rash or joint pain. Her PGM has RA. Her mother and sister have low WBC.    She has no significant past medical history. She takes oral contraceptive and occasional calcium supplement. She is healthy, active, and independent with daily function. 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 She travels. She does not have children. She is up to date on vaccines including COVID19 and age appropriate cancer screenings. She denies family history of blood disorder. Her father and PGF had prostate cancer.   Today, she presents to clinic by herself. She feels well. Denies fatigue, weight loss, night sweats, fever or chills. She had left arm axillary adenopathy after COVID19 vaccine  in December that resolved. Denies cough, chest pain, or dyspnea. Denies changes in bowel habits. She developed constipation after taking cipro for traveler's diarrhea last year and has occasional blood from hemorrhoids, no n/v/d.  Denies pain.   MEDICAL HISTORY:  Past Medical History:  Diagnosis Date  . Frequent UTI     SURGICAL HISTORY: Past Surgical History:  Procedure Laterality Date  . WISDOM TOOTH EXTRACTION      SOCIAL HISTORY: Social History   Socioeconomic History  . Marital status: Married    Spouse name: Not on file  . Number of children: Not on file  . Years of education: Not on file  . Highest education level: Not on file  Occupational History  . Not on file  Tobacco Use  . Smoking status: Never Smoker  . Smokeless tobacco: Never Used  Substance and Sexual Activity  . Alcohol use: Yes    Alcohol/week: 1.0 standard drinks    Types: 1 Shots of liquor per week    Comment: social  . Drug use: No  . Sexual activity: Yes    Birth control/protection: Pill  Other Topics Concern  . Not on file  Social History Narrative  . Not on file   Social Determinants of Health   Financial Resource Strain:   . Difficulty of Paying Living Expenses:   Food Insecurity:   . Worried About Charity fundraiser in the Last Year:   . Arboriculturist in the Last Year:   Transportation Needs:   . Film/video editor (Medical):   Marland Kitchen Lack of Transportation (Non-Medical):   Physical  Activity:   . Days of Exercise per Week:   . Minutes of Exercise per Session:   Stress:   . Feeling of Stress :   Social Connections:   . Frequency of Communication with Friends and Family:   . Frequency of Social Gatherings with Friends and Family:   . Attends Religious Services:   . Active Member of Clubs or Organizations:   . Attends Archivist Meetings:   Marland Kitchen Marital Status:   Intimate Partner Violence:   . Fear of Current or Ex-Partner:   . Emotionally Abused:   Marland Kitchen Physically  Abused:   . Sexually Abused:     FAMILY HISTORY: Family History  Problem Relation Age of Onset  . Hypertension Paternal Aunt   . Heart attack Maternal Grandmother 88  . Diverticulitis Maternal Grandmother   . Obesity Maternal Grandmother   . Diabetes Maternal Grandmother   . Rheum arthritis Paternal Grandmother   . Prostate cancer Father 47  . Prostate cancer Paternal Grandfather   . Tremor Mother   . Other Mother        palpitations  . Neutropenia Mother   . Neutropenia Sister   . Stroke Maternal Grandfather 80    ALLERGIES:  has No Known Allergies.  MEDICATIONS:  Current Outpatient Medications  Medication Sig Dispense Refill  . norgestimate-ethinyl estradiol (SPRINTEC 28) 0.25-35 MG-MCG tablet Take 1 tablet by mouth daily. 1 Package 11   No current facility-administered medications for this visit.    REVIEW OF SYSTEMS:   Constitutional: Denies fevers, chills or abnormal night sweats, weight loss  Eyes: Denies blurriness of vision, double vision or watery eyes Ears, nose, mouth, throat, and face: Denies mucositis or sore throat Respiratory: Denies cough, dyspnea or wheezes Cardiovascular: Denies palpitation, chest discomfort or lower extremity swelling Gastrointestinal:  Denies nausea, vomiting, diarrhea, hematochezia, heartburn or change in bowel habits (+) constipation  Skin: Denies abnormal skin rashes Lymphatics: Denies new lymphadenopathy or easy bruising Neurological:Denies numbness, tingling or new weaknesses Behavioral/Psych: Mood is stable, no new changes  MSK: Denies joint pain  All other systems were reviewed with the patient and are negative.  PHYSICAL EXAMINATION: ECOG PERFORMANCE STATUS: 0 - Asymptomatic  Vitals:   11/21/19 1335 11/21/19 1346  BP: (!) 141/92 122/90  Pulse: 95   Resp: 20   Temp: 98.9 F (37.2 C)   SpO2: 100%    Filed Weights   11/21/19 1335  Weight: 125 lb 8 oz (56.9 kg)    GENERAL:alert, no distress and comfortable SKIN:  no rash to uncovered skin  EYES: sclera clear NECK: without mass  LYMPH:  no palpable cervical, supraclavicular, or axillary lymphadenopathy LUNGS: clear with normal breathing effort HEART: regular rate & rhythm, no lower extremity edema ABDOMEN: abdomen soft, non-tender and normal bowel sounds. No hepatosplenomegaly  Musculoskeletal:no cyanosis of digits and no clubbing  PSYCH: alert & oriented x 3 with fluent speech NEURO: no focal motor/sensory deficits  LABORATORY DATA:  I have reviewed the data as listed CBC Latest Ref Rng & Units 11/21/2019 11/15/2019 10/18/2019  WBC 4.0 - 10.5 K/uL 4.3 3.5(L) 6.3  Hemoglobin 12.0 - 15.0 g/dL 13.1 12.7 12.8  Hematocrit 36.0 - 46.0 % 38.1 37.7 36.9  Platelets 150 - 400 K/uL 237 223.0 221.0    CMP Latest Ref Rng & Units 11/15/2019 11/15/2019 10/18/2019  Glucose 70 - 99 mg/dL - - 78  BUN 6 - 23 mg/dL - - 11  Creatinine 0.40 - 1.20 mg/dL - - 0.56  Sodium 135 - 145 mEq/L - - 136  Potassium 3.5 - 5.1 mEq/L - - 3.6  Chloride 96 - 112 mEq/L - - 102  CO2 19 - 32 mEq/L - - 26  Calcium 8.4 - 10.5 mg/dL - - 9.2  Total Protein 6.0 - 8.3 g/dL - 6.7 7.3  Total Bilirubin 0.2 - 1.2 mg/dL 1.6(H) 1.6(H) 1.8(H)  Alkaline Phos 39 - 117 U/L - 34(L) 34(L)  AST 0 - 37 U/L - 15 14  ALT 0 - 35 U/L - 11 13     RADIOGRAPHIC STUDIES: I have personally reviewed the radiological images as listed and agreed with the findings in the report. No results found.  ASSESSMENT & PLAN: 29 yo pleasant healthy female with   1. Leukopenia, predominant neutropenia  -we reviewed her available medical record and outside lab from past 4 years. She has a mild and stable leukopenia WBC 2.9 - 3.9 range since 2016 with isolated neutropenia ANC 0.9 recently.  -She has no history of chronic/recurrent infections, she has been vaccinated against hepatitis, no HIV risk factors. -she eats a regular/normal diet, will check E17, folic acid and MMA to r/o nutritional causes of neutropenia  -she  has hyperbilirubinemia with elevated indirect bili, which is seen in hemolysis. However, she has no other laboratory or clinical signs of this. No anemia or other cytopenias. We are referring her for abdominal US to r/o liver disease or splenomegaly  -We reviewed the possibility her WBC range is slightly lower than average population, but still normal WBC function. 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  -She has no s/sx of autoimmune disease, but a PGM had RA. Will obtain ANA, if positive would suggest immune-related neutropenia  -given the mild nature of her neutropenia/leukopenia, and otherwise normal CBC, there is very low suspicion for primary bone marrow disease, we are not recommending a bone marrow biopsy.  -She is UTD on age-appropriate vaccines including influenza and COVID19. We reviewed infection precautions.  -She does not need prophylactic antibiotics for routine procedures such as dental procedures. If her [place] <0.5 in the future, or she has recurrent infection, we would consider antibiotics. She does not need GCSF at this point. -We encouraged her to eat normal diet, exercise, reduce stress, get sleep, and consider taking multivitamin to strengthen her immune system -Her repeat CBC from today is completely normal. We will call her with results of pending labs and abdominal US. She can f/u with PCP and Ob/gyn with CBC q6-12 months.  -we will see her back as needed in the future.   PLAN: -Lab, medical record reviewed -Repeat CBC normal today -Will f/u on pending B12, folate RBC, MMA, and ANA from today -Abdominal [place] in the next 4 weeks (patient traveling soon) to r/o liver disease or splenomegaly -If work up is negative, f/u with PCP and ob/gyn with CBC q6-12 months  -F/u with [place] if needed for [place] <0.5 or recurrent/persistent infection or other concerns -CC note to Dr. Ethlyn Gallery PCP and Ob/gyn Dr. Aloha Gell   No problem-specific Assessment  & Plan notes found for this encounter.  Orders Placed This Encounter  Procedures  . US Abdomen Complete    Standing Status:   Future    Standing Expiration Date:   11/20/2020    Order Specific Question:   Reason for Exam (SYMPTOM  OR DIAGNOSIS REQUIRED)    Answer:   neutropenia, evaluate liver and spleen    Order Specific  Question:   Preferred imaging location?    Answer:   [facility]  . CBC with Differential ([place] Only)    Standing Status:   Standing    Number of Occurrences:   1    Standing Expiration Date:   11/20/2020  . ANA, IFA (with reflex)    Standing Status:   Future    Number of Occurrences:   1    Standing Expiration Date:   11/20/2020  . Folate RBC    Standing Status:   Standing    Number of Occurrences:   1    Standing Expiration Date:   11/20/2020  . Vitamin B12    Standing Status:   Standing    Number of Occurrences:   1    Standing Expiration Date:   11/20/2020  . Methylmalonic acid, serum    Standing Status:   Standing    Number of Occurrences:   1    Standing Expiration Date:   11/20/2020  . Save Smear (SSMR)    Standing Status:   Standing    Number of Occurrences:   1    Standing Expiration Date:   11/20/2020    All questions were answered. The patient knows to call the clinic with any problems, questions or concerns.     Alla Feeling, NP 11/21/19   Addendum  I have seen the patient, examined her. I agree with the assessment and and plan and have edited the notes.   Ms Rayle was referred for mild neutropenia, which has been chronic and intermittent. She does have family history of leukopenia in her mother and a sister, her sister has rheumatological disorder.  She is healthy, has no clinical concern for rheumatological disorder, or chronic infection.  She is not on medication or supplement which can cause neutropenia.  We discussed the etiology of neutropenia.  I think it is unlikely familial neutropenia, or autoimmune related. Will  repeat her CBC with differential, B12, folate acid level, and ANA.  We will also obtain a abdominal ultrasound to evaluate her liver and spleen.  If the above work-up is negative, we will see her as needed in the future.  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  Truitt Merle  11/21/2019

## 2019-11-22 LAB — FOLATE RBC
Folate, Hemolysate: 344 ng/mL
Folate, RBC: 894 ng/mL (ref >498)
Hematocrit: 38.5 % (ref 34.0–46.6)

## 2019-11-22 LAB — ANTINUCLEAR ANTIBODIES, IFA: ANA Ab, IFA: NEGATIVE

## 2019-11-25 LAB — METHYLMALONIC ACID, SERUM: Methylmalonic Acid, Quantitative: 119 nmol/L (ref 0–378)

## 2019-11-25 MED FILL — FEMYNOR 0.25-35 MG-MCG TABS: 0.25-35 | 84 days supply | Qty: 112 | Fill #2

## 2019-12-09 ENCOUNTER — Other Ambulatory Visit: Payer: Self-pay

## 2019-12-09 ENCOUNTER — Ambulatory Visit (HOSPITAL_COMMUNITY)
Admission: RE | Admit: 2019-12-09 | Discharge: 2019-12-09 | Disposition: A | Discharge status: Home / Self Care | Payer: No Typology Code available for payment source | Source: Ambulatory Visit | Attending: Nurse Practitioner | Admitting: Nurse Practitioner

## 2019-12-09 DIAGNOSIS — D709 Neutropenia, unspecified: Secondary | ICD-10-CM | POA: Insufficient documentation

## 2020-01-15 ENCOUNTER — Encounter: Payer: Self-pay | Admitting: Family Medicine

## 2020-01-15 DIAGNOSIS — L989 Disorder of the skin and subcutaneous tissue, unspecified: Secondary | ICD-10-CM

## 2020-02-12 NOTE — Addendum Note (Signed)
Addended by: Wynn Banker on: 02/12/2020 09:53 PM   Modules accepted: Orders

## 2020-02-12 NOTE — Telephone Encounter (Signed)
Please see patient's message. I have put new referral; she would like to go to [employer].

## 2020-05-15 ENCOUNTER — Other Ambulatory Visit (HOSPITAL_COMMUNITY): Payer: Self-pay | Admitting: Dermatology

## 2020-05-15 MED FILL — predniSONE 20 MG TABS: 20 | 12 days supply | Qty: 12 | Fill #0

## 2020-10-06 ENCOUNTER — Other Ambulatory Visit: Payer: Self-pay

## 2020-10-07 ENCOUNTER — Ambulatory Visit (INDEPENDENT_AMBULATORY_CARE_PROVIDER_SITE_OTHER): Payer: No Typology Code available for payment source | Admitting: Family Medicine

## 2020-10-07 ENCOUNTER — Encounter: Payer: Self-pay | Admitting: Family Medicine

## 2020-10-07 VITALS — BP 116/82 | HR 81 | Temp 97.9°F | Ht 68.75 in | Wt 117.5 lb

## 2020-10-07 DIAGNOSIS — Z0000 Encounter for general adult medical examination without abnormal findings: Secondary | ICD-10-CM | POA: Diagnosis not present

## 2020-10-07 DIAGNOSIS — Z131 Encounter for screening for diabetes mellitus: Secondary | ICD-10-CM

## 2020-10-07 DIAGNOSIS — Z83438 Family history of other disorder of lipoprotein metabolism and other lipidemia: Secondary | ICD-10-CM | POA: Diagnosis not present

## 2020-10-07 DIAGNOSIS — D709 Neutropenia, unspecified: Secondary | ICD-10-CM | POA: Insufficient documentation

## 2020-10-07 DIAGNOSIS — E538 Deficiency of other specified B group vitamins: Secondary | ICD-10-CM | POA: Diagnosis not present

## 2020-10-07 DIAGNOSIS — R17 Unspecified jaundice: Secondary | ICD-10-CM

## 2020-10-07 DIAGNOSIS — E559 Vitamin D deficiency, unspecified: Secondary | ICD-10-CM

## 2020-10-07 DIAGNOSIS — B070 Plantar wart: Secondary | ICD-10-CM

## 2020-10-07 DIAGNOSIS — R7989 Other specified abnormal findings of blood chemistry: Secondary | ICD-10-CM

## 2020-10-07 LAB — CBC WITH DIFFERENTIAL/PLATELET
Basophils Absolute: 0 10*3/uL (ref 0.0–0.1)
Basophils Relative: 1.4 % (ref 0.0–3.0)
Eosinophils Absolute: 0 10*3/uL (ref 0.0–0.7)
Eosinophils Relative: 0.8 % (ref 0.0–5.0)
HCT: 39.7 % (ref 36.0–46.0)
Hemoglobin: 13.6 g/dL (ref 12.0–15.0)
Lymphocytes Relative: 47.6 % — ABNORMAL HIGH (ref 12.0–46.0)
Lymphs Abs: 1.4 10*3/uL (ref 0.7–4.0)
MCHC: 34.3 g/dL (ref 30.0–36.0)
MCV: 94.4 fl (ref 78.0–100.0)
Monocytes Absolute: 0.2 10*3/uL (ref 0.1–1.0)
Monocytes Relative: 8.1 % (ref 3.0–12.0)
Neutro Abs: 1.3 10*3/uL — ABNORMAL LOW (ref 1.4–7.7)
Neutrophils Relative %: 42.1 % — ABNORMAL LOW (ref 43.0–77.0)
Platelets: 231 10*3/uL (ref 150.0–400.0)
RBC: 4.21 Mil/uL (ref 3.87–5.11)
RDW: 12.7 % (ref 11.5–15.5)
WBC: 3 10*3/uL — ABNORMAL LOW (ref 4.0–10.5)

## 2020-10-07 LAB — COMPREHENSIVE METABOLIC PANEL
ALT: 14 U/L (ref 0–35)
AST: 15 U/L (ref 0–37)
Albumin: 4.5 g/dL (ref 3.5–5.2)
Alkaline Phosphatase: 48 U/L (ref 39–117)
BUN: 10 mg/dL (ref 6–23)
CO2: 27 mEq/L (ref 19–32)
Calcium: 9.9 mg/dL (ref 8.4–10.5)
Chloride: 105 mEq/L (ref 96–112)
Creatinine, Ser: 0.57 mg/dL (ref 0.40–1.20)
GFR: 121.24 mL/min (ref >60.00)
Glucose, Bld: 87 mg/dL (ref 70–99)
Potassium: 4 mEq/L (ref 3.5–5.1)
Sodium: 139 mEq/L (ref 135–145)
Total Bilirubin: 1.7 mg/dL — ABNORMAL HIGH (ref 0.2–1.2)
Total Protein: 7.3 g/dL (ref 6.0–8.3)

## 2020-10-07 LAB — TSH: TSH: 2.52 u[IU]/mL (ref 0.35–4.50)

## 2020-10-07 LAB — VITAMIN B12: Vitamin B-12: 324 pg/mL (ref 211–911)

## 2020-10-07 LAB — VITAMIN D 25 HYDROXY (VIT D DEFICIENCY, FRACTURES): VITD: 35.43 ng/mL (ref 30.00–100.00)

## 2020-10-07 LAB — HEMOGLOBIN A1C: Hgb A1c MFr Bld: 5.1 % (ref 4.6–6.5)

## 2020-10-07 MED ORDER — PRENATAL MULTIVITAMIN CH: 1.0000 | ORAL_TABLET | Freq: Every day | ORAL | Status: AC

## 2020-10-07 NOTE — Addendum Note (Signed)
Addended by: Evert Kohl D on: 10/07/2020 09:55 AM   Modules accepted: Orders

## 2020-10-07 NOTE — Progress Notes (Addendum)
Joy Nichols DOB: 1991/07/22 Encounter date: 10/07/2020  This is a 30 y.o. female who presents for complete physical   History of present illness/Additional concerns: Last visit was 10/18/2019 to establish care.  Follows with Dr. Ernestina Penna for gyn needs.  She was referred to hematology after leukopenia was noted on her last set of blood work.  They completed evaluation including abdominal ultrasound which was normal.    Has seen derm a few times since her last visit here. Dr. Roderic Scarce. Has had injections for wart in foot.    Past Medical History:  Diagnosis Date  . Frequent UTI    Past Surgical History:  Procedure Laterality Date  . WISDOM TOOTH EXTRACTION     No Known Allergies Current Meds  Medication Sig  . [DISCONTINUED] Prenatal Vit-Fe Fumarate-FA (PRENATAL MULTIVITAMIN) TABS tablet Take 1 tablet by mouth daily.   Social History   Tobacco Use  . Smoking status: Never Smoker  . Smokeless tobacco: Never Used  Substance Use Topics  . Alcohol use: Yes    Alcohol/week: 1.0 standard drink    Types: 1 Shots of liquor per week    Comment: social   Family History  Problem Relation Age of Onset  . Hypertension Paternal Aunt   . Heart attack Maternal Grandmother 88  . Diverticulitis Maternal Grandmother   . Obesity Maternal Grandmother   . Diabetes Maternal Grandmother   . Rheum arthritis Paternal Grandmother   . Prostate cancer Father 39  . CAD Father        calcifications vessels  . Hyperlipidemia Father   . Prostate cancer Paternal Grandfather   . Tremor Mother   . Other Mother        palpitations  . Neutropenia Mother   . Neutropenia Sister   . Stroke Maternal Grandfather 80     Review of Systems  Constitutional: Negative for activity change, appetite change, chills, fatigue, fever and unexpected weight change.  HENT: Negative for congestion, ear pain, hearing loss, sinus pressure, sinus pain, sore throat and trouble swallowing.   Eyes: Negative for pain and  visual disturbance.  Respiratory: Negative for cough, chest tightness, shortness of breath and wheezing.   Cardiovascular: Negative for chest pain, palpitations and leg swelling.  Gastrointestinal: Negative for abdominal pain, blood in stool, constipation, diarrhea, nausea and vomiting.  Genitourinary: Negative for difficulty urinating and menstrual problem.  Musculoskeletal: Negative for arthralgias and back pain.  Skin: Negative for rash.  Neurological: Negative for dizziness, weakness, numbness and headaches.  Hematological: Negative for adenopathy. Does not bruise/bleed easily.  Psychiatric/Behavioral: Negative for sleep disturbance and suicidal ideas. The patient is not nervous/anxious.     CBC:  Lab Results  Component Value Date   WBC 4.3 11/21/2019   WBC 3.5 (L) 11/15/2019   HGB 13.1 11/21/2019   HCT 38.1 11/21/2019   HCT 38.5 11/21/2019   MCH 31.8 11/21/2019   MCHC 34.4 11/21/2019   RDW 13.0 11/21/2019   PLT 237 11/21/2019   MPV 9.0 04/29/2015   CMP: Lab Results  Component Value Date   NA 136 10/18/2019   K 3.6 10/18/2019   CL 102 10/18/2019   CO2 26 10/18/2019   GLUCOSE 78 10/18/2019   BUN 11 10/18/2019   CREATININE 0.56 10/18/2019   CREATININE 0.72 04/29/2015   GFRAA >89 04/29/2015   CALCIUM 9.2 10/18/2019   PROT 6.7 11/15/2019   BILITOT 1.6 (H) 11/15/2019   BILITOT 1.6 (H) 11/15/2019   ALKPHOS 34 (L) 11/15/2019   ALT  11 11/15/2019   AST 15 11/15/2019   LIPID: Lab Results  Component Value Date   CHOL 167 03/07/2019   TRIG 87 03/07/2019   HDL 59 03/07/2019   LDLCALC 91 03/07/2019    Objective:  BP 116/82 (BP Location: Left Arm, Patient Position: Sitting, Cuff Size: Normal)   Pulse 81   Temp 97.9 F (36.6 C) (Oral)   Ht 5' 8.75" (1.746 m)   Wt 117 lb 8 oz (53.3 kg)   LMP 10/05/2020 (Exact Date)   SpO2 99%   BMI 17.48 kg/m   Weight: 117 lb 8 oz (53.3 kg)   BP Readings from Last 3 Encounters:  10/07/20 116/82  11/21/19 122/90  10/18/19  122/80   Wt Readings from Last 3 Encounters:  10/07/20 117 lb 8 oz (53.3 kg)  11/21/19 125 lb 8 oz (56.9 kg)  10/18/19 122 lb 9.6 oz (55.6 kg)    Physical Exam Constitutional:      General: She is not in acute distress.    Appearance: She is well-developed.  HENT:     Head: Normocephalic and atraumatic.     Right Ear: External ear normal.     Left Ear: External ear normal.     Mouth/Throat:     Pharynx: No oropharyngeal exudate.  Eyes:     Conjunctiva/sclera: Conjunctivae normal.     Pupils: Pupils are equal, round, and reactive to light.  Neck:     Thyroid: No thyromegaly.  Cardiovascular:     Rate and Rhythm: Normal rate and regular rhythm.     Heart sounds: Normal heart sounds. No murmur heard. No friction rub. No gallop.   Pulmonary:     Effort: Pulmonary effort is normal.     Breath sounds: Normal breath sounds.  Abdominal:     General: Bowel sounds are normal. There is no distension.     Palpations: Abdomen is soft. There is no mass.     Tenderness: There is no abdominal tenderness. There is no guarding.     Hernia: No hernia is present.  Musculoskeletal:        General: No tenderness or deformity. Normal range of motion.     Cervical back: Normal range of motion and neck [place].  Lymphadenopathy:     Cervical: No cervical adenopathy.  Skin:    General: Skin is warm and dry.     Findings: No rash.  Neurological:     Mental Status: She is alert and oriented to person, place, and time.     Deep Tendon Reflexes: Reflexes normal.     Reflex Scores:      Tricep reflexes are 2+ on the right side and 2+ on the left side.      Bicep reflexes are 2+ on the right side and 2+ on the left side.      Brachioradialis reflexes are 2+ on the right side and 2+ on the left side.      Patellar reflexes are 2+ on the right side and 2+ on the left side. Psychiatric:        Speech: Speech normal.        Behavior: Behavior normal.        Thought Content: Thought content normal.      Cryotherapy Procedure Note  Pre-operative Diagnosis: plantar warts, 9  Post-operative Diagnosis: same  Locations: bilateral balls of foot; great toe right foot  Procedure Details  Patient informed of the risks, including bleeding and infection, and benefits of the  procedure and Verbal informed consent obtained. The lesion and surrounding area was cleansed with alcohol and lesion was shaved superficially using a dermablade.  Three cycles of liquid nitrogen applied to the area with 1-47mm perimeter with pause between cycles. Patient tolerated procedure well.  Complications: none.  Plan: 1. Discussed that there may be blister formation. Keep wound clean, dry. OK to apply antibiotic ointment if needed.  2. Warning signs of infection were reviewed.   3. Return in 2 weeks for re-treatment if lesion persists.    Assessment/Plan: There are no preventive care reminders to display for this patient. Health Maintenance reviewed - recommend regular exercise.  1. Preventative health care Encouraged regular exercise.  Otherwise up to date with preventative health.   2. Neutropenia, unspecified type (HCC) Will recheck labs.  - CBC with Differential/Platelet; Future  3. B12 deficiency - Vitamin B12; Future  4. Family history of hyperlipidemia Patient would like to check some more extensive bloodwork today to get more complete cholesterol eval.  - Lipoprotein A (LPA); Future - Apolipoprotein B; Future - NMR, lipoprofile; Future  5. Elevated bilirubin - CBC with Differential/Platelet; Future - Comprehensive metabolic panel; Future  6. Screening for diabetes mellitus - Hemoglobin A1c; Future  7. Vitamin D deficiency - VITAMIN D 25 Hydroxy (Vit-D Deficiency, Fractures); Future  8. Elevated TSH - TSH; Future   9. Plantar warts Frozen in office today; return for retreatment in 2-3 weeks if needed.   Return in about 1 year (around 10/07/2021) for physical exam (can return  in 2-3 weeks for wart refreeze if desired).  Theodis Shove, MD

## 2020-10-07 NOTE — Addendum Note (Signed)
Addended by: Wynn Banker on: 10/07/2020 09:45 AM   Modules accepted: Orders

## 2020-10-08 ENCOUNTER — Encounter: Payer: Self-pay | Admitting: Family Medicine

## 2020-10-09 LAB — NMR, LIPOPROFILE
Cholesterol, Total: 139 mg/dL (ref 100–199)
HDL Particle Number: 32 umol/L (ref >=30.5)
HDL-C: 62 mg/dL (ref >39)
LDL Particle Number: 581 nmol/L (ref <1000)
LDL Size: 21 nm (ref >20.5)
LDL-C (NIH Calc): 67 mg/dL (ref 0–99)
LP-IR Score: <25 (ref <=45)
Small LDL Particle Number: 165 nmol/L (ref <=527)
Triglycerides: 43 mg/dL (ref 0–149)

## 2020-10-09 LAB — APOLIPOPROTEIN B: Apolipoprotein B: 59 mg/dL (ref <90)

## 2020-10-11 LAB — LIPOPROTEIN A (LPA): Lipoprotein (a): 89 nmol/L — ABNORMAL HIGH (ref <75)

## 2020-10-12 NOTE — Telephone Encounter (Signed)
Per Charlann Boxer lab called and stated the sample is too old to add additional testing.  Message sent to PCP.

## 2020-10-12 NOTE — Telephone Encounter (Signed)
Lab add-on request faxed to [name] at [phone].

## 2020-10-19 ENCOUNTER — Encounter: Payer: No Typology Code available for payment source | Admitting: Family Medicine

## 2020-10-21 ENCOUNTER — Other Ambulatory Visit (HOSPITAL_COMMUNITY): Payer: Self-pay | Admitting: *Deleted

## 2020-10-27 ENCOUNTER — Ambulatory Visit (HOSPITAL_COMMUNITY)
Admission: RE | Admit: 2020-10-27 | Discharge: 2020-10-27 | Disposition: A | Discharge status: Home / Self Care | Payer: Self-pay | Source: Ambulatory Visit | Attending: Cardiology | Admitting: Cardiology

## 2020-10-27 ENCOUNTER — Other Ambulatory Visit: Payer: Self-pay

## 2020-11-04 ENCOUNTER — Other Ambulatory Visit (HOSPITAL_COMMUNITY): Payer: Self-pay

## 2020-11-04 MED ORDER — DOXYCYCLINE HYCLATE 100 MG PO TABS
100.0000 mg | ORAL_TABLET | Freq: Two times a day (BID) | ORAL | 0 refills | Status: AC
  Filled 2020-11-04: qty 20, 10d supply, fill #0

## 2020-12-02 ENCOUNTER — Other Ambulatory Visit: Payer: Self-pay

## 2020-12-02 ENCOUNTER — Encounter: Payer: Self-pay | Admitting: Family Medicine

## 2020-12-02 ENCOUNTER — Ambulatory Visit (INDEPENDENT_AMBULATORY_CARE_PROVIDER_SITE_OTHER): Payer: No Typology Code available for payment source | Admitting: Family Medicine

## 2020-12-02 VITALS — BP 104/70 | HR 82 | Temp 98.7°F | Ht 68.75 in | Wt 117.4 lb

## 2020-12-02 DIAGNOSIS — B070 Plantar wart: Secondary | ICD-10-CM

## 2020-12-02 NOTE — Progress Notes (Signed)
  Joy Nichols DOB: 1991-02-24 Encounter date: 12/02/2020  This is a 30 y.o. female who presents with Chief Complaint  Patient presents with  . Follow-up    History of present illness: Has been dealing with warts on feet for multiple months; has seen derm and even completed bleomycin treatment. They are improving, but here for retreatment today. Warts are easier to see after bathing, so she does have some pictures on her phone today to aid with freezing in the office.    No Known Allergies Current Meds  Medication Sig  . Prenatal Vit-Fe Fumarate-FA (PRENATAL MULTIVITAMIN) TABS tablet Take 1 tablet by mouth daily.    Review of Systems  Skin:       Plantar warts, both feet    Objective:  BP 104/70 (BP Location: Left Arm, Patient Position: Sitting, Cuff Size: Normal)   Pulse 82   Temp 98.7 F (37.1 C) (Oral)   Ht 5' 8.75" (1.746 m)   Wt 117 lb 6.4 oz (53.3 kg)   LMP 11/10/2020 (Exact Date)   SpO2 99%   BMI 17.46 kg/m   Weight: 117 lb 6.4 oz (53.3 kg)   BP Readings from Last 3 Encounters:  12/02/20 104/70  10/07/20 116/82  11/21/19 122/90   Wt Readings from Last 3 Encounters:  12/02/20 117 lb 6.4 oz (53.3 kg)  10/07/20 117 lb 8 oz (53.3 kg)  11/21/19 125 lb 8 oz (56.9 kg)    Physical Exam Vitals reviewed.  Musculoskeletal:       Feet:  Feet:     Comments: Multiple plantar warts - have significantly lessened from prior exam; great toes bilat; ball of foot Neurological:     Mental Status: She is alert.    Cryotherapy Procedure Note  Pre-operative Diagnosis: plantar wart  Post-operative Diagnosis: same  Locations: bilat feet  Procedure Details  Patient informed of the risks, including bleeding and infection, and benefits of the  procedure and Verbal informed consent obtained. The lesion and surrounding area was cleansed with alcohol and lesion was shaved superficially using a dermablade.  Three cycles of liquid nitrogen applied to the area with 1-21mm  perimeter with pause between cycles. Patient tolerated procedure well.  Complications: none.  Plan: 1. Discussed that there may be blister formation. Keep wound clean, dry. OK to apply antibiotic ointment if needed.  2. Warning signs of infection were reviewed.   3. Return in 2 weeks for re-treatment if lesion persists.    Assessment/Plan  1. Plantar wart of both feet Tolerated treatment well today. Lesions are improving; will continue to treat as needed/patient desires until resolved. - PR DESTRUCTION BENIGN LESIONS UP TO 14    Return if symptoms worsen or fail to improve.    Theodis Shove, MD

## 2021-01-11 ENCOUNTER — Other Ambulatory Visit: Payer: Self-pay

## 2021-01-11 ENCOUNTER — Ambulatory Visit (INDEPENDENT_AMBULATORY_CARE_PROVIDER_SITE_OTHER): Payer: No Typology Code available for payment source | Admitting: Family Medicine

## 2021-01-11 ENCOUNTER — Encounter: Payer: Self-pay | Admitting: Family Medicine

## 2021-01-11 VITALS — BP 118/82 | HR 94 | Temp 98.4°F | Ht 69.0 in | Wt 116.0 lb

## 2021-01-11 DIAGNOSIS — B070 Plantar wart: Secondary | ICD-10-CM

## 2021-01-11 NOTE — Progress Notes (Signed)
  Joy Nichols DOB: 12/22/1990 Encounter date: 01/11/2021  This is a 30 y.o. female who presents with Chief Complaint  Patient presents with   Follow-up    Wart removal     History of present illness: She is returning today for wart treatment on her feet.  She did not had significant improvement after last treatment, and feels that there are some areas which are actually more prominent (for her it is easiest to see after bath when skin is softened).  She has no pain or discomfort with sports, but is aggravated that they are still there after multiple treatments.  She has not attempted any additional home treatments since last visit.  She did tolerate treatment well last visit.  No Known Allergies No outpatient medications have been marked as taking for the 01/11/21 encounter (Office Visit) with Wynn Banker, MD.    Review of Systems  Constitutional:  Negative for chills, fatigue and fever.  Respiratory:  Negative for cough, chest tightness, shortness of breath and wheezing.   Cardiovascular:  Negative for chest pain, palpitations and leg swelling.  Skin:        See HPI   Objective:  BP 118/82   Pulse 94   Temp 98.4 F (36.9 C) (Oral)   Ht 5\' 9"  (1.753 m)   Wt 116 lb (52.6 kg)   SpO2 99%   BMI 17.13 kg/m   Weight: 116 lb (52.6 kg)   BP Readings from Last 3 Encounters:  01/11/21 118/82  12/02/20 104/70  10/07/20 116/82   Wt Readings from Last 3 Encounters:  01/11/21 116 lb (52.6 kg)  12/02/20 117 lb 6.4 oz (53.3 kg)  10/07/20 117 lb 8 oz (53.3 kg)    Physical Exam Constitutional:      Appearance: Normal appearance.  Neurological:     Mental Status: She is alert.    Assessment/Plan Cryotherapy Procedure Note  Pre-operative Diagnosis:   Post-operative Diagnosis: same  Locations: plantar foot  bilat ball, great toe Procedure Details  Patient informed of the risks, including bleeding and infection, and benefits of the  procedure and Verbal informed  consent obtained. The lesion and surrounding area was cleansed with alcohol and lesion was shaved superficially using a dermal curette.  Three cycles of liquid nitrogen applied to the area with 1-9mm perimeter with pause between cycles. Patient tolerated procedure well.  Complications: none.  Plan:  Return in 2 weeks for re-treatment if lesion persists.  3m, MD

## 2021-03-22 ENCOUNTER — Other Ambulatory Visit: Payer: Self-pay

## 2021-03-22 ENCOUNTER — Encounter: Payer: Self-pay | Admitting: Family Medicine

## 2021-03-22 ENCOUNTER — Ambulatory Visit (INDEPENDENT_AMBULATORY_CARE_PROVIDER_SITE_OTHER): Payer: No Typology Code available for payment source | Admitting: Family Medicine

## 2021-03-22 VITALS — BP 100/80 | HR 67 | Temp 98.1°F | Ht 69.0 in | Wt 116.5 lb

## 2021-03-22 DIAGNOSIS — B070 Plantar wart: Secondary | ICD-10-CM

## 2021-03-22 NOTE — Progress Notes (Signed)
  Joy Nichols DOB: April 09, 1991 Encounter date: 03/22/2021  This is a 30 y.o. female who presents for: follow up of wart, treatment   History of present illness:  Left feet alone for awhile and could still see warts there. After being at beach could see that they were still there; could see darker wart spots. Has been using some home salicylic acid treatments. Has seen some improvement.     No Known Allergies Current Meds  Medication Sig   Prenatal Vit-Fe Fumarate-FA (PRENATAL MULTIVITAMIN) TABS tablet Take 1 tablet by mouth daily.    Review of Systems  Constitutional:  Negative for chills, fatigue and fever.  Respiratory:  Negative for cough, chest tightness, shortness of breath and wheezing.   Cardiovascular:  Negative for chest pain, palpitations and leg swelling.  Skin:        Warts still present. Not painful.   Objective:  BP 100/80 (BP Location: Left Arm, Patient Position: Sitting, Cuff Size: Normal)   Pulse 67   Temp 98.1 F (36.7 C) (Oral)   Ht 5\' 9"  (1.753 m)   Wt 116 lb 8 oz (52.8 kg)   LMP 03/16/2021 (Exact Date)   SpO2 99%   BMI 17.20 kg/m   Weight: 116 lb 8 oz (52.8 kg)   BP Readings from Last 3 Encounters:  03/22/21 100/80  01/11/21 118/82  12/02/20 104/70   Wt Readings from Last 3 Encounters:  03/22/21 116 lb 8 oz (52.8 kg)  01/11/21 116 lb (52.6 kg)  12/02/20 117 lb 6.4 oz (53.3 kg)    Physical Exam Constitutional:      Appearance: Normal appearance.  Neurological:     Mental Status: She is alert.    Cryotherapy Procedure Note  Pre-operative Diagnosis: plantar warts  Post-operative Diagnosis: same  Locations: bilateral ball of foot, great toe - clustered approx 8 distinct plantar warts  Procedure Details  Patient informed of the risks, including bleeding and infection, and benefits of the  procedure and Verbal informed consent obtained. The lesion and surrounding area was cleansed with alcohol and lesion was shaved superficially using  a dermablade.  Three cycles of liquid nitrogen applied to the area with 1-84mm perimeter with pause between cycles. Patient tolerated procedure well.  Complications: none.  Plan: 1. Discussed that there may be blister formation. Keep wound clean, dry. OK to apply antibiotic ointment if needed.  2. Warning signs of infection were reviewed.   3. Return in 2 weeks for re-treatment if lesion persists.  She is getting resolution of some wart; smaller new warts have come up. With combination of at home and in office treatment; I do feel like she is making progress with full resolution. Has option to return to derm or here for retreatment if desired.   3m, MD

## 2021-07-08 LAB — OB RESULTS CONSOLE ABO/RH: RH Type: POSITIVE

## 2021-07-08 LAB — OB RESULTS CONSOLE ANTIBODY SCREEN: Antibody Screen: NEGATIVE

## 2021-08-27 LAB — OB RESULTS CONSOLE RPR: RPR: NONREACTIVE

## 2021-08-27 LAB — OB RESULTS CONSOLE RUBELLA ANTIBODY, IGM: Rubella: NON-IMMUNE/NOT IMMUNE

## 2021-08-27 LAB — OB RESULTS CONSOLE GC/CHLAMYDIA
Chlamydia: NEGATIVE
Neisseria Gonorrhea: NEGATIVE

## 2021-08-27 LAB — OB RESULTS CONSOLE HEPATITIS B SURFACE ANTIGEN: Hepatitis B Surface Ag: NEGATIVE

## 2021-08-27 LAB — OB RESULTS CONSOLE HIV ANTIBODY (ROUTINE TESTING): HIV: NONREACTIVE

## 2021-09-27 ENCOUNTER — Other Ambulatory Visit (HOSPITAL_COMMUNITY): Payer: Self-pay

## 2021-09-27 MED ORDER — ACETAZOLAMIDE 125 MG PO TABS
125.0000 mg | ORAL_TABLET | Freq: Two times a day (BID) | ORAL | 0 refills | Status: DC
  Filled 2021-09-27: qty 20, 10d supply, fill #0

## 2021-10-08 ENCOUNTER — Encounter: Payer: Self-pay | Admitting: Family Medicine

## 2021-10-08 ENCOUNTER — Ambulatory Visit (INDEPENDENT_AMBULATORY_CARE_PROVIDER_SITE_OTHER): Payer: No Typology Code available for payment source | Admitting: Family Medicine

## 2021-10-08 VITALS — BP 108/70 | HR 78 | Temp 98.5°F | Ht 69.0 in | Wt 125.3 lb

## 2021-10-08 DIAGNOSIS — Z0000 Encounter for general adult medical examination without abnormal findings: Secondary | ICD-10-CM | POA: Diagnosis not present

## 2021-10-08 NOTE — Progress Notes (Signed)
Joy Nichols ?DOB: May 14, 1991 ?Encounter date: 10/08/2021 ? ?This is a 31 y.o. female who presents for complete physical  ? ?History of present illness/Additional concerns: ?She is pregnant - 17 weeks with girl. Feels great. No symptoms. Following with Dr. Pamala Hurry.  ? ?Traveling to [place] tomorrow.  ? ?Past Medical History:  ?Diagnosis Date  ? Frequent UTI   ? ?Past Surgical History:  ?Procedure Laterality Date  ? WISDOM TOOTH EXTRACTION    ? ?No Known Allergies ?Current Meds  ?Medication Sig  ? acetaZOLAMIDE (DIAMOX) 125 MG tablet Take 1 tablet (125 mg total) by mouth 2 (two) times daily.  ? Prenatal Vit-Fe Fumarate-FA (PRENATAL MULTIVITAMIN) TABS tablet Take 1 tablet by mouth daily.  ? ?Social History  ? ?Tobacco Use  ? Smoking status: Never  ? Smokeless tobacco: Never  ?Substance Use Topics  ? Alcohol use: Yes  ?  Alcohol/week: 1.0 standard drink  ?  Types: 1 Shots of liquor per week  ?  Comment: social  ? ?Family History  ?Problem Relation Age of Onset  ? Hypertension Paternal Aunt   ? Heart attack Maternal Grandmother 88  ? Diverticulitis Maternal Grandmother   ? Obesity Maternal Grandmother   ? Diabetes Maternal Grandmother   ? Rheum arthritis Paternal Grandmother   ? Prostate cancer Father 65  ? CAD Father   ?     calcifications vessels  ? Hyperlipidemia Father   ? Prostate cancer Paternal Grandfather   ? Tremor Mother   ? Other Mother   ?     palpitations  ? Neutropenia Mother   ? Neutropenia Sister   ? Stroke Maternal Grandfather 63  ? ? ? ?Review of Systems  ?Constitutional:  Negative for activity change, appetite change, chills, fatigue, fever and unexpected weight change.  ?HENT:  Negative for congestion, ear pain, hearing loss, sinus pressure, sinus pain, sore throat and trouble swallowing.   ?Eyes:  Negative for pain and visual disturbance.  ?Respiratory:  Negative for cough, chest tightness, shortness of breath and wheezing.   ?Cardiovascular:  Negative for chest pain, palpitations and leg swelling.   ?Gastrointestinal:  Negative for abdominal pain, blood in stool, constipation, diarrhea, nausea and vomiting.  ?Genitourinary:  Negative for difficulty urinating and menstrual problem.  ?Musculoskeletal:  Negative for arthralgias and back pain.  ?Skin:  Negative for rash.  ?Neurological:  Negative for dizziness, weakness, numbness and headaches.  ?Hematological:  Negative for adenopathy. Does not bruise/bleed easily.  ?Psychiatric/Behavioral:  Negative for sleep disturbance and suicidal ideas. The patient is not nervous/anxious.   ? ?CBC:  ?Lab Results  ?Component Value Date  ? WBC 3.0 (L) 10/07/2020  ? HGB 13.6 10/07/2020  ? HGB 13.1 11/21/2019  ? HCT 39.7 10/07/2020  ? HCT 38.5 11/21/2019  ? MCH 31.8 11/21/2019  ? MCHC 34.3 10/07/2020  ? RDW 12.7 10/07/2020  ? PLT 231.0 10/07/2020  ? PLT 237 11/21/2019  ? MPV 9.0 04/29/2015  ? ?CMP: ?Lab Results  ?Component Value Date  ? NA 139 10/07/2020  ? K 4.0 10/07/2020  ? CL 105 10/07/2020  ? CO2 27 10/07/2020  ? GLUCOSE 87 10/07/2020  ? BUN 10 10/07/2020  ? CREATININE 0.57 10/07/2020  ? CREATININE 0.72 04/29/2015  ? GFRAA >89 04/29/2015  ? CALCIUM 9.9 10/07/2020  ? PROT 7.3 10/07/2020  ? BILITOT 1.7 (H) 10/07/2020  ? ALKPHOS 48 10/07/2020  ? ALT 14 10/07/2020  ? AST 15 10/07/2020  ? ?LIPID: ?Lab Results  ?Component Value Date  ? CHOL  167 03/07/2019  ? TRIG 87 03/07/2019  ? HDL 59 03/07/2019  ? [place] 91 03/07/2019  ? ? ?Objective: ? ?BP 108/70 (BP Location: Left Arm, Patient Position: Sitting, Cuff Size: Normal)   Pulse 78   Temp 98.5 ?F (36.9 ?C) (Oral)   Ht 5\' 9"  (1.753 m)   Wt 125 lb 4.8 oz (56.8 kg)   LMP 03/16/2021 (Exact Date)   SpO2 100%   BMI 18.50 kg/m?   Weight: 125 lb 4.8 oz (56.8 kg)  ? ?BP Readings from Last 3 Encounters:  ?10/08/21 108/70  ?03/22/21 100/80  ?01/11/21 118/82  ? ?Wt Readings from Last 3 Encounters:  ?10/08/21 125 lb 4.8 oz (56.8 kg)  ?03/22/21 116 lb 8 oz (52.8 kg)  ?01/11/21 116 lb (52.6 kg)  ? ? ?Physical Exam ?Constitutional:   ?    General: She is not in acute distress. ?   Appearance: She is well-developed.  ?HENT:  ?   Head: Normocephalic and atraumatic.  ?   Right Ear: External ear normal.  ?   Left Ear: External ear normal.  ?   Mouth/Throat:  ?   Pharynx: No oropharyngeal exudate.  ?Eyes:  ?   Conjunctiva/sclera: Conjunctivae normal.  ?   Pupils: Pupils are equal, round, and reactive to light.  ?Neck:  ?   Thyroid: No thyromegaly.  ?Cardiovascular:  ?   Rate and Rhythm: Normal rate and regular rhythm.  ?   Heart sounds: Normal heart sounds. No murmur heard. ?  No friction rub. No gallop.  ?Pulmonary:  ?   Effort: Pulmonary effort is normal.  ?   Breath sounds: Normal breath sounds.  ?Abdominal:  ?   General: Bowel sounds are normal. There is no distension.  ?   Palpations: Abdomen is soft. There is no mass.  ?   Tenderness: There is no abdominal tenderness. There is no guarding.  ?   Hernia: No hernia is present.  ?Musculoskeletal:     ?   General: No tenderness or deformity. Normal range of motion.  ?   Cervical back: Normal range of motion and neck [place].  ?Lymphadenopathy:  ?   Cervical: No cervical adenopathy.  ?Skin: ?   General: Skin is warm and dry.  ?   Findings: No rash.  ?Neurological:  ?   Mental Status: She is alert and oriented to person, place, and time.  ?   Deep Tendon Reflexes: Reflexes normal.  ?   Reflex Scores: ?     Tricep reflexes are 2+ on the right side and 2+ on the left side. ?     Bicep reflexes are 2+ on the right side and 2+ on the left side. ?     Brachioradialis reflexes are 2+ on the right side and 2+ on the left side. ?     Patellar reflexes are 2+ on the right side and 2+ on the left side. ?Psychiatric:     ?   Speech: Speech normal.     ?   Behavior: Behavior normal.     ?   Thought Content: Thought content normal.  ? ? ?Assessment/Plan: ?There are no preventive care reminders to display for this patient. ?Health Maintenance reviewed. ? ? ?1. Preventative health care ?Keep up with healthy lifestyle! She  is following with Dr. Pamala Hurry regularly for routine ob care.  ? ? ?Return in about 1 year (around 10/09/2022) for physical exam. ? ?Micheline Rough, MD ? ? ? ? ? ?

## 2021-10-08 NOTE — Patient Instructions (Signed)
If you need pelvic support/low back discomfort - SRC compression out of [place].  ?

## 2022-02-09 LAB — OB RESULTS CONSOLE GBS: GBS: NEGATIVE

## 2022-03-15 ENCOUNTER — Telehealth (HOSPITAL_COMMUNITY): Payer: Self-pay | Admitting: *Deleted

## 2022-03-15 ENCOUNTER — Other Ambulatory Visit: Payer: Self-pay | Admitting: Obstetrics

## 2022-03-15 ENCOUNTER — Encounter (HOSPITAL_COMMUNITY): Payer: Self-pay | Admitting: *Deleted

## 2022-03-15 NOTE — Telephone Encounter (Signed)
Preadmission screen  

## 2022-03-16 ENCOUNTER — Encounter (HOSPITAL_COMMUNITY): Payer: Self-pay | Admitting: *Deleted

## 2022-03-16 NOTE — H&P (Signed)
Joy Nichols is a 31 y.o. G1P0 at [redacted]w[redacted]d presenting for elective IOL. Pt notes rare contractions. Good fetal movement, No vaginal bleeding, not leaking fluid.  [facility]are at [facility] since 8 wks - Dated by LMP c/w 8 wk u/s - Rubella N-I. Plan MMR PP - anemia, on iron   Prenatal Transfer Tool  Maternal Diabetes: No Genetic Screening: Normal Maternal Ultrasounds/Referrals: Normal Fetal Ultrasounds or other Referrals:  None Maternal Substance Abuse:  No Significant Maternal Medications:  None Significant Maternal Lab Results: Group B Strep negative     OB History     Gravida  1   Para      Term      Preterm      AB      Living         SAB      IAB      Ectopic      Multiple      Live Births             Past Medical History:  Diagnosis Date   Frequent UTI    Past Surgical History:  Procedure Laterality Date   WISDOM TOOTH EXTRACTION     Family History: family history includes CAD in her father; Diabetes in her maternal grandmother; Diverticulitis in her maternal grandmother; Heart attack (age of onset: 25) in her maternal grandmother; Hyperlipidemia in her father; Hypertension in her paternal aunt; Neutropenia in her mother and sister; Obesity in her maternal grandmother; Other in her mother; Prostate cancer in her paternal grandfather; Prostate cancer (age of onset: 65) in her father; Rheum arthritis in her paternal grandmother; Stroke (age of onset: 87) in her maternal grandfather; Tremor in her mother. Social History:  reports that she has never smoked. She has never used smokeless tobacco. She reports current alcohol use of about 1.0 standard drink of alcohol per week. She reports that she does not use drugs.  Review of Systems - Negative except discomfort of pregnancy    Physical Exam:  Vitals:   03/17/22 1442 03/17/22 1612  BP: 117/88 125/89  Pulse: 87 78  Resp: 15   Temp: 98 F (36.7 C)     Gen: well appearing, no distress  Back: no  CVAT Abd: gravid, NT, no RUQ pain LE: no edema, equal bilaterally, non-tender Toco: rare FH: baseline 130s, accelerations present, no deceleratons, 10 beat variability  Cvx FT/ 20%/ medium consistency/ mid position Cervical foley placement: pt placed in stirrups, speculum placed, cvx visualized, betadine x 3. Foley threaded through cervix and pt immediately began bleeding bright red blood. Balloon inflated to 60cc but bleeding continued through foley outflow channel. About 150cc in 1 min. Foley removed. Cvx checked again. Ectropion present but not bleeding, bleeding from inside cvx. Vtx presention, no appreciable placenta previa/ low lying placenta. Cvx now 1cm/ 20%. Bleeding stopped once foley removed.   Bedside u/s. Post/ fundal placenta  NST: 130s, reactive, no decels Toco: irritibility  Prenatal labs: ABO, Rh: AB/Positive/-- (12/15 0000) Antibody: Negative (12/15 0000) Rubella: Nonimmune (02/03 0000) RPR: Nonreactive (02/03 0000)  HBsAg: Negative (02/03 0000)  HIV: Non-reactive (02/03 0000)  GBS: Negative/-- (07/19 0000)  1 hr Glucola 109  Genetic screening normal Anatomy US normal   Assessment/Plan: 31 y.o. G1P0 at [redacted]w[redacted]d - Elective IOL at term. Plan foley/ pit - GBS neg - watch Hgb given h/o anemia - MMR PP   Joy Nichols 03/16/2022, 6:33 PM   IOL at term Reactive fetal testing Initial  plan for foley with pit vs cytotec but now with unexplained aggressive vaginal bleeding of unclear etiology. Will start with pitocin. D/w pt risks of c/s.  Ala Dach 03/17/2022 4:50 PM

## 2022-03-17 ENCOUNTER — Inpatient Hospital Stay (HOSPITAL_COMMUNITY): Payer: No Typology Code available for payment source

## 2022-03-17 ENCOUNTER — Encounter (HOSPITAL_COMMUNITY): Payer: Self-pay | Admitting: Obstetrics

## 2022-03-17 ENCOUNTER — Inpatient Hospital Stay (HOSPITAL_COMMUNITY): Payer: No Typology Code available for payment source | Admitting: Anesthesiology

## 2022-03-17 ENCOUNTER — Inpatient Hospital Stay (HOSPITAL_COMMUNITY)
Admission: AD | Admit: 2022-03-17 | Discharge: 2022-03-20 | DRG: 788 | Disposition: A | Discharge status: Home / Self Care | Payer: No Typology Code available for payment source | Attending: Obstetrics | Admitting: Obstetrics

## 2022-03-17 DIAGNOSIS — Z3A40 40 weeks gestation of pregnancy: Secondary | ICD-10-CM | POA: Diagnosis not present

## 2022-03-17 DIAGNOSIS — O26893 Other specified pregnancy related conditions, third trimester: Secondary | ICD-10-CM | POA: Diagnosis present

## 2022-03-17 DIAGNOSIS — O9902 Anemia complicating childbirth: Principal | ICD-10-CM | POA: Diagnosis present

## 2022-03-17 DIAGNOSIS — Z23 Encounter for immunization: Secondary | ICD-10-CM | POA: Diagnosis not present

## 2022-03-17 DIAGNOSIS — Z3490 Encounter for supervision of normal pregnancy, unspecified, unspecified trimester: Secondary | ICD-10-CM | POA: Diagnosis present

## 2022-03-17 DIAGNOSIS — Z98891 History of uterine scar from previous surgery: Secondary | ICD-10-CM

## 2022-03-17 LAB — TYPE AND SCREEN
ABO/RH(D): AB POS
Antibody Screen: NEGATIVE

## 2022-03-17 LAB — CBC
HCT: 37.1 % (ref 36.0–46.0)
Hemoglobin: 13.3 g/dL (ref 12.0–15.0)
MCH: 34.4 pg — ABNORMAL HIGH (ref 26.0–34.0)
MCHC: 35.8 g/dL (ref 30.0–36.0)
MCV: 95.9 fL (ref 80.0–100.0)
Platelets: 194 10*3/uL (ref 150–400)
RBC: 3.87 MIL/uL (ref 3.87–5.11)
RDW: 13.3 % (ref 11.5–15.5)
WBC: 8.1 10*3/uL (ref 4.0–10.5)
nRBC: 0.2 % (ref 0.0–0.2)

## 2022-03-17 LAB — COMPREHENSIVE METABOLIC PANEL
ALT: 15 U/L (ref 0–44)
AST: 19 U/L (ref 15–41)
Albumin: 3 g/dL — ABNORMAL LOW (ref 3.5–5.0)
Alkaline Phosphatase: 167 U/L — ABNORMAL HIGH (ref 38–126)
Anion gap: 7 (ref 5–15)
BUN: 11 mg/dL (ref 6–20)
CO2: 18 mmol/L — ABNORMAL LOW (ref 22–32)
Calcium: 9.3 mg/dL (ref 8.9–10.3)
Chloride: 110 mmol/L (ref 98–111)
Creatinine, Ser: 0.58 mg/dL (ref 0.44–1.00)
GFR, Estimated: >60 mL/min (ref >60)
Glucose, Bld: 88 mg/dL (ref 70–99)
Potassium: 3.8 mmol/L (ref 3.5–5.1)
Sodium: 135 mmol/L (ref 135–145)
Total Bilirubin: 0.9 mg/dL (ref 0.3–1.2)
Total Protein: 6.5 g/dL (ref 6.5–8.1)

## 2022-03-17 MED ORDER — LACTATED RINGERS IV SOLN: 500.0000 mL | Freq: Once | INTRAVENOUS | Status: DC

## 2022-03-17 MED ORDER — FENTANYL-BUPIVACAINE-NACL 0.5-0.125-0.9 MG/250ML-% EP SOLN
12.0000 mL/h | EPIDURAL | Status: DC | PRN
  Administered 2022-03-17: 12 mL/h via EPIDURAL
  Filled 2022-03-17: qty 250

## 2022-03-17 MED ORDER — OXYTOCIN-SODIUM CHLORIDE 30-0.9 UT/500ML-% IV SOLN: 2.5000 [IU]/h | INTRAVENOUS | Status: DC

## 2022-03-17 MED ORDER — SOD CITRATE-CITRIC ACID 500-334 MG/5ML PO SOLN
30.0000 mL | ORAL | Status: DC | PRN
  Administered 2022-03-18: 30 mL via ORAL
  Filled 2022-03-17: qty 30

## 2022-03-17 MED ORDER — EPHEDRINE 5 MG/ML INJ
10.0000 mg | INTRAVENOUS | Status: DC | PRN
Start: 2022-03-17 — End: 2022-03-18
  Administered 2022-03-18: 10 mg via INTRAVENOUS

## 2022-03-17 MED ORDER — FENTANYL CITRATE (PF) 100 MCG/2ML IJ SOLN
INTRAMUSCULAR | Status: AC
  Filled 2022-03-17: qty 2

## 2022-03-17 MED ORDER — ACETAMINOPHEN 325 MG PO TABS: 650.0000 mg | ORAL_TABLET | ORAL | Status: DC | PRN

## 2022-03-17 MED ORDER — LIDOCAINE HCL (PF) 1 % IJ SOLN
INTRAMUSCULAR | Status: DC | PRN
  Administered 2022-03-17: 5 mL via EPIDURAL
  Administered 2022-03-17: 2 mL via EPIDURAL
  Administered 2022-03-17: 3 mL via EPIDURAL

## 2022-03-17 MED ORDER — PHENYLEPHRINE 80 MCG/ML (10ML) SYRINGE FOR IV PUSH (FOR BLOOD PRESSURE SUPPORT)
80.0000 ug | PREFILLED_SYRINGE | INTRAVENOUS | Status: DC | PRN
  Filled 2022-03-17: qty 10

## 2022-03-17 MED ORDER — EPHEDRINE 5 MG/ML INJ
10.0000 mg | INTRAVENOUS | Status: DC | PRN
  Filled 2022-03-17: qty 5

## 2022-03-17 MED ORDER — TERBUTALINE SULFATE 1 MG/ML IJ SOLN: 0.2500 mg | Freq: Once | INTRAMUSCULAR | Status: DC | PRN

## 2022-03-17 MED ORDER — LACTATED RINGERS IV SOLN: INTRAVENOUS | Status: DC

## 2022-03-17 MED ORDER — OXYTOCIN-SODIUM CHLORIDE 30-0.9 UT/500ML-% IV SOLN
1.0000 m[IU]/min | INTRAVENOUS | Status: DC
  Administered 2022-03-17: 2 m[IU]/min via INTRAVENOUS
  Filled 2022-03-17: qty 500

## 2022-03-17 MED ORDER — ONDANSETRON HCL 4 MG/2ML IJ SOLN
4.0000 mg | Freq: Four times a day (QID) | INTRAMUSCULAR | Status: DC | PRN
  Administered 2022-03-18: 4 mg via INTRAVENOUS
  Filled 2022-03-17: qty 2

## 2022-03-17 MED ORDER — FENTANYL CITRATE (PF) 100 MCG/2ML IJ SOLN
50.0000 ug | INTRAMUSCULAR | Status: DC | PRN
  Administered 2022-03-17: 100 ug via INTRAVENOUS

## 2022-03-17 MED ORDER — DIPHENHYDRAMINE HCL 50 MG/ML IJ SOLN: 12.5000 mg | INTRAMUSCULAR | Status: DC | PRN

## 2022-03-17 MED ORDER — PHENYLEPHRINE 80 MCG/ML (10ML) SYRINGE FOR IV PUSH (FOR BLOOD PRESSURE SUPPORT)
80.0000 ug | PREFILLED_SYRINGE | INTRAVENOUS | Status: AC | PRN
  Administered 2022-03-17 (×3): 80 ug via INTRAVENOUS

## 2022-03-17 MED ORDER — LIDOCAINE HCL (PF) 1 % IJ SOLN: 30.0000 mL | INTRAMUSCULAR | Status: DC | PRN

## 2022-03-17 MED ORDER — OXYTOCIN BOLUS FROM INFUSION: 333.0000 mL | Freq: Once | INTRAVENOUS | Status: DC

## 2022-03-17 MED ORDER — LACTATED RINGERS IV SOLN: 500.0000 mL | INTRAVENOUS | Status: DC | PRN

## 2022-03-17 NOTE — Progress Notes (Signed)
S: Doing well, no complaints, pain well controlled not yet needing epidural.  Bleeding stopped  O: BP (!) 121/90   Pulse 65   Temp 98 F (36.7 C) (Oral)   Resp 15   Ht 5\' 9"  (1.753 m)   Wt 68.9 kg   LMP 03/16/2021 (Exact Date)   BMI 22.45 kg/m    FHT:  FHR: 120 bpm, variability: moderate,  accelerations:  Present,  decelerations:  Absent UC:   regular, every 4 minutes SVE:    Not rechecked   A / P:  31 y.o.  OB History  Gravida Para Term Preterm AB Living  1 0 0 0 0 0  SAB IAB Ectopic Multiple Live Births  0 0 0 0 0   at [redacted]w[redacted]d Induction of labor at term.  Elective.  Continue to increase Pitocin.  Unclear etiology of bleeding with cervical Foley placement.  No further bleeding and active fetus  Fetal Wellbeing:  Category I Pain Control:  Labor support without medications  Anticipated MOD:  NSVD  [redacted]w[redacted]d 03/17/2022, 5:42 PM +

## 2022-03-17 NOTE — Anesthesia Preprocedure Evaluation (Signed)
Anesthesia Evaluation  Patient identified by MRN, date of birth, ID band Patient awake    Reviewed: Allergy & Precautions, NPO status , Patient's Chart, lab work & pertinent test results  Airway Mallampati: II  TM Distance: >3 FB Neck ROM: Full    Dental  (+) Teeth Intact, Dental Advisory Given   Pulmonary neg pulmonary ROS,    Pulmonary exam normal breath sounds clear to auscultation       Cardiovascular negative cardio ROS Normal cardiovascular exam Rhythm:Regular Rate:Normal     Neuro/Psych negative neurological ROS     GI/Hepatic negative GI ROS, Neg liver ROS,   Endo/Other  negative endocrine ROS  Renal/GU negative Renal ROS     Musculoskeletal negative musculoskeletal ROS (+)   Abdominal   Peds  Hematology negative hematology ROS (+) Plt 194k    Anesthesia Other Findings Day of surgery medications reviewed with the patient.  Reproductive/Obstetrics (+) Pregnancy                             Anesthesia Physical Anesthesia Plan  ASA: 2  Anesthesia Plan: Epidural   Post-op Pain Management:    Induction:   PONV Risk Score and Plan: 2 and Treatment may vary due to age or medical condition  Airway Management Planned: Natural Airway  Additional Equipment:   Intra-op Plan:   Post-operative Plan:   Informed Consent: I have reviewed the patients History and Physical, chart, labs and discussed the procedure including the risks, benefits and alternatives for the proposed anesthesia with the patient or authorized representative who has indicated his/her understanding and acceptance.     Dental advisory given  Plan Discussed with:   Anesthesia Plan Comments: (Patient identified. Risks/Benefits/Options discussed with patient including but not limited to bleeding, infection, nerve damage, paralysis, failed block, incomplete pain control, headache, blood pressure changes, nausea,  vomiting, reactions to medication both or allergic, itching and postpartum back pain. Confirmed with bedside nurse the patient's most recent platelet count. Confirmed with patient that they are not currently taking any anticoagulation, have any bleeding history or any family history of bleeding disorders. Patient expressed understanding and wished to proceed. All questions were answered. )        Anesthesia Quick Evaluation

## 2022-03-17 NOTE — Anesthesia Procedure Notes (Signed)
Epidural Patient location during procedure: OB Start time: 03/17/2022 11:16 PM End time: 03/17/2022 11:23 PM  Staffing Anesthesiologist: Collene Schlichter, MD Performed: anesthesiologist   Preanesthetic Checklist Completed: patient identified, IV checked, risks and benefits discussed, monitors and equipment checked, pre-op evaluation and timeout performed  Epidural Patient position: sitting Prep: DuraPrep Patient monitoring: blood pressure and continuous pulse ox Approach: midline Location: L3-L4 Injection technique: LOR air  Needle:  Needle type: Tuohy  Needle gauge: 17 G Needle length: 9 cm Needle insertion depth: 4 cm Catheter size: 19 Gauge Catheter at skin depth: 9 cm Test dose: negative and Other (1% Lidocaine)  Additional Notes Patient identified.  Risk benefits discussed including failed block, incomplete pain control, headache, nerve damage, paralysis, blood pressure changes, nausea, vomiting, reactions to medication both toxic or allergic, and postpartum back pain.  Patient expressed understanding and wished to proceed.  All questions were answered.  Sterile technique used throughout procedure and epidural site dressed with sterile barrier dressing. No paresthesia or other complications noted. The patient did not experience any signs of intravascular injection such as tinnitus or metallic taste in mouth nor signs of intrathecal spread such as rapid motor block. Please see nursing notes for vital signs. Reason for block:procedure for pain

## 2022-03-17 NOTE — Progress Notes (Signed)
S: Doing well, no complaints, pain worsening, recent Fentanyl w/o much improvement, pt seems furstrated with pain and slow progress. Not noticing significant bleeding.   O: BP 108/63   Pulse 76   Temp 98 F (36.7 C) (Oral)   Resp 17   Ht 5\' 9"  (1.753 m)   Wt 68.9 kg   LMP 03/16/2021 (Exact Date)   BMI 22.45 kg/m    FHT:  FHR: 120s bpm, variability: moderate,  accelerations:  Present,  decelerations:  Absent UC:   irregular SVE:   Dilation: 1.5 Effacement (%): 70 Station: -3 Exam by:: [email or web address], MD No bleeding with exam  A / P:  30 y.o.  OB History  Gravida Para Term Preterm AB Living  1 0 0 0 0 0  SAB IAB Ectopic Multiple Live Births  0 0 0 0 0   at [redacted]w[redacted]d IOL at term  Continue pitocin  Fetal Wellbeing:  Category I Pain Control:   IV pain meds but recc proceeding with epidural  Anticipated MOD:   early in labor, expect SVD  [redacted]w[redacted]d 03/17/2022, 11:03 PM

## 2022-03-18 ENCOUNTER — Encounter (HOSPITAL_COMMUNITY): Payer: Self-pay | Admitting: Obstetrics

## 2022-03-18 ENCOUNTER — Other Ambulatory Visit: Payer: Self-pay

## 2022-03-18 ENCOUNTER — Encounter (HOSPITAL_COMMUNITY)
Admission: AD | Disposition: A | Discharge status: Home / Self Care | Payer: Self-pay | Source: Home / Self Care | Attending: Obstetrics

## 2022-03-18 DIAGNOSIS — Z98891 History of uterine scar from previous surgery: Secondary | ICD-10-CM

## 2022-03-18 DIAGNOSIS — Z3A40 40 weeks gestation of pregnancy: Secondary | ICD-10-CM

## 2022-03-18 LAB — RPR: RPR Ser Ql: NONREACTIVE

## 2022-03-18 SURGERY — Surgical Case
Anesthesia: Epidural

## 2022-03-18 MED ORDER — MORPHINE SULFATE (PF) 0.5 MG/ML IJ SOLN
INTRAMUSCULAR | Status: AC
  Filled 2022-03-18: qty 10

## 2022-03-18 MED ORDER — PROMETHAZINE HCL 25 MG/ML IJ SOLN: 6.2500 mg | INTRAMUSCULAR | Status: DC | PRN

## 2022-03-18 MED ORDER — OXYTOCIN-SODIUM CHLORIDE 30-0.9 UT/500ML-% IV SOLN
INTRAVENOUS | Status: AC
  Filled 2022-03-18: qty 500

## 2022-03-18 MED ORDER — ONDANSETRON HCL 4 MG/2ML IJ SOLN
INTRAMUSCULAR | Status: AC
  Filled 2022-03-18: qty 4

## 2022-03-18 MED ORDER — CEFAZOLIN SODIUM-DEXTROSE 2-3 GM-%(50ML) IV SOLR
INTRAVENOUS | Status: DC | PRN
  Administered 2022-03-18: 2 g via INTRAVENOUS

## 2022-03-18 MED ORDER — PHENYLEPHRINE HCL (PRESSORS) 10 MG/ML IV SOLN
INTRAVENOUS | Status: DC | PRN
  Administered 2022-03-18: 80 ug via INTRAVENOUS

## 2022-03-18 MED ORDER — TETANUS-DIPHTH-ACELL PERTUSSIS 5-2.5-18.5 LF-MCG/0.5 IM SUSY: 0.5000 mL | PREFILLED_SYRINGE | Freq: Once | INTRAMUSCULAR | Status: DC

## 2022-03-18 MED ORDER — OXYTOCIN-SODIUM CHLORIDE 30-0.9 UT/500ML-% IV SOLN
2.5000 [IU]/h | INTRAVENOUS | Status: AC
  Administered 2022-03-18: 2.5 [IU]/h via INTRAVENOUS
  Filled 2022-03-18: qty 500

## 2022-03-18 MED ORDER — ACETAMINOPHEN 10 MG/ML IV SOLN
INTRAVENOUS | Status: DC | PRN
  Administered 2022-03-18: 1000 mg via INTRAVENOUS

## 2022-03-18 MED ORDER — OXYCODONE HCL 5 MG PO TABS: 5.0000 mg | ORAL_TABLET | Freq: Once | ORAL | Status: DC | PRN

## 2022-03-18 MED ORDER — MORPHINE SULFATE (PF) 0.5 MG/ML IJ SOLN
INTRAMUSCULAR | Status: DC | PRN
  Administered 2022-03-18: 3 mg via EPIDURAL

## 2022-03-18 MED ORDER — KETOROLAC TROMETHAMINE 30 MG/ML IJ SOLN
30.0000 mg | Freq: Four times a day (QID) | INTRAMUSCULAR | Status: AC
  Administered 2022-03-18 – 2022-03-19 (×3): 30 mg via INTRAVENOUS
  Filled 2022-03-18 (×3): qty 1

## 2022-03-18 MED ORDER — OXYCODONE HCL 5 MG/5ML PO SOLN: 5.0000 mg | Freq: Once | ORAL | Status: DC | PRN

## 2022-03-18 MED ORDER — DIPHENHYDRAMINE HCL 25 MG PO CAPS: 25.0000 mg | ORAL_CAPSULE | ORAL | Status: DC | PRN

## 2022-03-18 MED ORDER — NALOXONE HCL 4 MG/10ML IJ SOLN: 1.0000 ug/kg/h | INTRAVENOUS | Status: DC | PRN

## 2022-03-18 MED ORDER — FENTANYL CITRATE (PF) 100 MCG/2ML IJ SOLN
INTRAMUSCULAR | Status: AC
  Filled 2022-03-18: qty 2

## 2022-03-18 MED ORDER — LIDOCAINE-EPINEPHRINE (PF) 2 %-1:200000 IJ SOLN
INTRAMUSCULAR | Status: AC
  Filled 2022-03-18: qty 20

## 2022-03-18 MED ORDER — KETOROLAC TROMETHAMINE 30 MG/ML IJ SOLN: 30.0000 mg | Freq: Once | INTRAMUSCULAR | Status: DC | PRN

## 2022-03-18 MED ORDER — LIDOCAINE-EPINEPHRINE (PF) 2 %-1:200000 IJ SOLN
INTRAMUSCULAR | Status: DC | PRN
  Administered 2022-03-18 (×4): 5 mL via EPIDURAL

## 2022-03-18 MED ORDER — DIBUCAINE (PERIANAL) 1 % EX OINT: 1.0000 | TOPICAL_OINTMENT | CUTANEOUS | Status: DC | PRN

## 2022-03-18 MED ORDER — MEPERIDINE HCL 25 MG/ML IJ SOLN: 6.2500 mg | INTRAMUSCULAR | Status: DC | PRN

## 2022-03-18 MED ORDER — ONDANSETRON HCL 4 MG/2ML IJ SOLN
INTRAMUSCULAR | Status: DC | PRN
  Administered 2022-03-18: 4 mg via INTRAVENOUS

## 2022-03-18 MED ORDER — FENTANYL CITRATE (PF) 100 MCG/2ML IJ SOLN
INTRAMUSCULAR | Status: DC | PRN
Start: 2022-03-18 — End: 2022-03-18
  Administered 2022-03-18: 100 ug via EPIDURAL

## 2022-03-18 MED ORDER — ACETAMINOPHEN 10 MG/ML IV SOLN
INTRAVENOUS | Status: AC
  Filled 2022-03-18: qty 100

## 2022-03-18 MED ORDER — OXYCODONE HCL 5 MG PO TABS
5.0000 mg | ORAL_TABLET | ORAL | Status: DC | PRN
  Administered 2022-03-19 – 2022-03-20 (×2): 5 mg via ORAL
  Filled 2022-03-18 (×2): qty 1

## 2022-03-18 MED ORDER — METHYLERGONOVINE MALEATE 0.2 MG/ML IJ SOLN
INTRAMUSCULAR | Status: AC
  Filled 2022-03-18: qty 1

## 2022-03-18 MED ORDER — ACETAMINOPHEN 500 MG PO TABS
1000.0000 mg | ORAL_TABLET | Freq: Four times a day (QID) | ORAL | Status: DC
  Administered 2022-03-18 – 2022-03-20 (×7): 1000 mg via ORAL
  Filled 2022-03-18 (×7): qty 2

## 2022-03-18 MED ORDER — DEXAMETHASONE SODIUM PHOSPHATE 4 MG/ML IJ SOLN
INTRAMUSCULAR | Status: AC
  Filled 2022-03-18: qty 1

## 2022-03-18 MED ORDER — DEXAMETHASONE SODIUM PHOSPHATE 4 MG/ML IJ SOLN
INTRAMUSCULAR | Status: DC | PRN
  Administered 2022-03-18: 4 mg via INTRAVENOUS

## 2022-03-18 MED ORDER — HYDROMORPHONE HCL 1 MG/ML IJ SOLN: 0.2500 mg | INTRAMUSCULAR | Status: DC | PRN

## 2022-03-18 MED ORDER — COCONUT OIL OIL: 1.0000 | TOPICAL_OIL | Status: DC | PRN

## 2022-03-18 MED ORDER — SIMETHICONE 80 MG PO CHEW: 80.0000 mg | CHEWABLE_TABLET | ORAL | Status: DC | PRN

## 2022-03-18 MED ORDER — SODIUM CHLORIDE 0.9% FLUSH: 3.0000 mL | INTRAVENOUS | Status: DC | PRN

## 2022-03-18 MED ORDER — NALOXONE HCL 0.4 MG/ML IJ SOLN: 0.4000 mg | INTRAMUSCULAR | Status: DC | PRN

## 2022-03-18 MED ORDER — DIPHENHYDRAMINE HCL 50 MG/ML IJ SOLN: 12.5000 mg | INTRAMUSCULAR | Status: DC | PRN

## 2022-03-18 MED ORDER — IBUPROFEN 600 MG PO TABS
600.0000 mg | ORAL_TABLET | Freq: Four times a day (QID) | ORAL | Status: DC
  Administered 2022-03-19 – 2022-03-20 (×4): 600 mg via ORAL
  Filled 2022-03-18 (×5): qty 1

## 2022-03-18 MED ORDER — DIPHENHYDRAMINE HCL 25 MG PO CAPS: 25.0000 mg | ORAL_CAPSULE | Freq: Four times a day (QID) | ORAL | Status: DC | PRN

## 2022-03-18 MED ORDER — LACTATED RINGERS IV SOLN: INTRAVENOUS | Status: DC

## 2022-03-18 MED ORDER — METHYLERGONOVINE MALEATE 0.2 MG/ML IJ SOLN
INTRAMUSCULAR | Status: DC | PRN
  Administered 2022-03-18: .2 mg via INTRAMUSCULAR

## 2022-03-18 MED ORDER — WITCH HAZEL-GLYCERIN EX PADS: 1.0000 | MEDICATED_PAD | CUTANEOUS | Status: DC | PRN

## 2022-03-18 MED ORDER — SODIUM CHLORIDE 0.9 % IR SOLN
Status: DC | PRN
  Administered 2022-03-18: 1000 mL

## 2022-03-18 MED ORDER — PRENATAL MULTIVITAMIN CH
1.0000 | ORAL_TABLET | Freq: Every day | ORAL | Status: DC
Start: 2022-03-19 — End: 2022-03-20
  Administered 2022-03-19 – 2022-03-20 (×2): 1 via ORAL
  Filled 2022-03-18 (×2): qty 1

## 2022-03-18 MED ORDER — SIMETHICONE 80 MG PO CHEW
80.0000 mg | CHEWABLE_TABLET | Freq: Three times a day (TID) | ORAL | Status: DC
  Administered 2022-03-18 – 2022-03-20 (×4): 80 mg via ORAL
  Filled 2022-03-18 (×4): qty 1

## 2022-03-18 MED ORDER — CEFAZOLIN SODIUM-DEXTROSE 2-4 GM/100ML-% IV SOLN
INTRAVENOUS | Status: AC
  Filled 2022-03-18: qty 100

## 2022-03-18 MED ORDER — ZOLPIDEM TARTRATE 5 MG PO TABS: 5.0000 mg | ORAL_TABLET | Freq: Every evening | ORAL | Status: DC | PRN

## 2022-03-18 MED ORDER — OXYTOCIN-SODIUM CHLORIDE 30-0.9 UT/500ML-% IV SOLN
INTRAVENOUS | Status: DC | PRN
  Administered 2022-03-18: 300 mL via INTRAVENOUS

## 2022-03-18 MED ORDER — MENTHOL 3 MG MT LOZG: 1.0000 | LOZENGE | OROMUCOSAL | Status: DC | PRN

## 2022-03-18 MED ORDER — SENNOSIDES-DOCUSATE SODIUM 8.6-50 MG PO TABS
2.0000 | ORAL_TABLET | ORAL | Status: DC
  Administered 2022-03-18 – 2022-03-20 (×2): 2 via ORAL
  Filled 2022-03-18 (×2): qty 2

## 2022-03-18 SURGICAL SUPPLY — 33 items
BENZOIN TINCTURE PRP APPL 2/3 (GAUZE/BANDAGES/DRESSINGS) IMPLANT
CHLORAPREP W/TINT 26ML (MISCELLANEOUS) ×2 IMPLANT
CLAMP CORD UMBIL (MISCELLANEOUS) ×1 IMPLANT
CLOTH BEACON ORANGE TIMEOUT ST (SAFETY) ×1 IMPLANT
DRSG OPSITE POSTOP 4X10 (GAUZE/BANDAGES/DRESSINGS) ×1 IMPLANT
ELECT REM PT RETURN 9FT ADLT (ELECTROSURGICAL) ×1
ELECTRODE REM PT RTRN 9FT ADLT (ELECTROSURGICAL) ×1 IMPLANT
EXTRACTOR VACUUM M CUP 4 TUBE (SUCTIONS) IMPLANT
GLOVE BIO SURGEON STRL SZ 6.5 (GLOVE) ×1 IMPLANT
GLOVE BIOGEL PI IND STRL 7.0 (GLOVE) ×3 IMPLANT
GLOVE BIOGEL PI INDICATOR 7.0 (GLOVE) ×3
GOWN STRL REUS W/TWL LRG LVL3 (GOWN DISPOSABLE) ×2 IMPLANT
KIT ABG SYR 3ML LUER SLIP (SYRINGE) IMPLANT
NDL HYPO 25X5/8 SAFETYGLIDE (NEEDLE) IMPLANT
NEEDLE HYPO 22GX1.5 SAFETY (NEEDLE) IMPLANT
NEEDLE HYPO 25X5/8 SAFETYGLIDE (NEEDLE) IMPLANT
NS IRRIG 1000ML POUR BTL (IV SOLUTION) ×1 IMPLANT
PACK C SECTION WH (CUSTOM PROCEDURE TRAY) ×1 IMPLANT
PAD OB MATERNITY 4.3X12.25 (PERSONAL CARE ITEMS) ×1 IMPLANT
STRIP CLOSURE SKIN 1/2X4 (GAUZE/BANDAGES/DRESSINGS) IMPLANT
SUT MON AB 4-0 PS1 27 (SUTURE) ×1 IMPLANT
SUT PLAIN 0 NONE (SUTURE) IMPLANT
SUT PLAIN 2 0 XLH (SUTURE) IMPLANT
SUT VIC AB 0 CT1 36 (SUTURE) ×2 IMPLANT
SUT VIC AB 0 CTX 36 (SUTURE) ×3
SUT VIC AB 0 CTX36XBRD ANBCTRL (SUTURE) ×2 IMPLANT
SUT VIC AB 2-0 CT1 27 (SUTURE) ×1
SUT VIC AB 2-0 CT1 TAPERPNT 27 (SUTURE) ×1 IMPLANT
SUT VIC AB 4-0 KS 27 (SUTURE) IMPLANT
SYR CONTROL 10ML LL (SYRINGE) IMPLANT
TOWEL OR 17X24 6PK STRL BLUE (TOWEL DISPOSABLE) ×1 IMPLANT
TRAY FOLEY W/BAG SLVR 14FR LF (SET/KITS/TRAYS/PACK) IMPLANT
WATER STERILE IRR 1000ML POUR (IV SOLUTION) ×1 IMPLANT

## 2022-03-18 NOTE — Progress Notes (Addendum)
S: Doing well, no complaints, pain well controlled with epidural  O: BP 122/81   Pulse 100   Temp 98.8 F (37.1 C) (Oral)   Resp 16   Ht 5\' 9"  (1.753 m)   Wt 68.9 kg   LMP 03/16/2021 (Exact Date)   SpO2 100%   BMI 22.45 kg/m    FHT:  FHR: 120s bpm, variability: moderate,  accelerations:  Present,  decelerations:  Absent UC:   irregular, every 3-4 minutes SVE:   Dilation: 10 Effacement (%): 80 Station: Plus 1 Exam by:: Miguelangel Korn   A / P:  30 y.o.  OB History  Gravida Para Term Preterm AB Living  1 0 0 0 0 0  SAB IAB Ectopic Multiple Live Births  0 0 0 0 0   at [redacted]w[redacted]d IOL at term, good progress of induced labor with pitocin. Pt 8cm at 3am, rechecked at 730am and 10/100%/ +1. Now with pushing x >3hrs with no change in descent and significant hematuria.   Fetal Wellbeing:  Category I Pain Control:  Epidural  Anticipated MOD:   recc PCS.   [redacted]w[redacted]d 03/18/2022, 10:38 AM  R/B c/s d/w pt who agrees to proceed.   03/20/2022 03/18/2022 10:51 AM

## 2022-03-18 NOTE — Lactation Note (Signed)
This note was copied from a baby's chart. Lactation Consultation Note  Patient Name: Joy Nichols ENIDP'O Date: 03/18/2022 Reason for consult: Initial assessment;Mother's request;Difficult latch;Primapara;1st time breastfeeding;Term;Breastfeeding assistance Age:33 hours  Birth parent awake and called for latch assistance. On arrival, bruising and redness from a previous shallow latch. LC assisted to get more depth on the breast in cross cradle prone.  Infant still feeding at the end of the visit.   Plan 1. To feed based on cues 8-12x 24 hr period. Birth parent to offer breasts and look for signs of milk transfer.  2. If unable to latch, hand express and offer colostrum on a spoon then try a latch.  All questions answered at the end of the visit.   Maternal Data Has patient been taught Hand Expression?: Yes Does the patient have breastfeeding experience prior to this delivery?: No  Feeding Mother's Current Feeding Choice: Breast Milk  LATCH Score Latch: Repeated attempts needed to sustain latch, nipple held in mouth throughout feeding, stimulation needed to elicit sucking reflex.  Audible Swallowing: Spontaneous and intermittent  Type of Nipple: Everted at rest and after stimulation  Comfort (Breast/Nipple): Soft / non-tender  Hold (Positioning): Assistance needed to correctly position infant at breast and maintain latch.  LATCH Score: 8   Lactation Tools Discussed/Used    Interventions Interventions: Breast feeding basics reviewed;Assisted with latch;Skin to skin;Breast massage;Hand express;Breast compression;Adjust position;Support pillows;Position options;Expressed milk;Education;LC Psychologist, educational;Infant Driven Feeding Algorithm education  Discharge Pump: Manual WIC Program: No  Consult Status Consult Status: Follow-up Date: 03/19/22 Follow-up type: In-patient    Jarica Plass  [name] 03/18/2022, 10:15 PM

## 2022-03-18 NOTE — Brief Op Note (Signed)
03/17/2022 - 03/18/2022  12:06 PM  PATIENT:  Joy Nichols  31 y.o. female  PRE-OPERATIVE DIAGNOSIS:  Primary Cesarean Section for Arrest of Descent  POST-OPERATIVE DIAGNOSIS:  Primary Cesarean Section for Arrest of Descent  PROCEDURE:  Procedure(s): CESAREAN SECTION (N/A) Low-transverse cesarean section with 2 layer closure  SURGEON:  Surgeon(s) and Role:    Noland Fordyce, MD - Primary  PHYSICIAN ASSISTANT:   ASSISTANTS: Dorisann Frames, CNM  ANESTHESIA:   epidural  EBL: Per nursing notes  BLOOD ADMINISTERED:none  DRAINS: Urinary Catheter (Foley)   LOCAL MEDICATIONS USED:  NONE  SPECIMEN: Placenta to labor and delivery for disposal  DISPOSITION OF SPECIMEN:  N/A  COUNTS:  YES  TOURNIQUET:  * No tourniquets in log *  DICTATION: .Note written in EPIC  PLAN OF CARE: Admit to inpatient   PATIENT DISPOSITION:  PACU - hemodynamically stable.   Delay start of Pharmacological VTE agent (>24hrs) due to surgical blood loss or risk of bleeding: yes

## 2022-03-18 NOTE — Transfer of Care (Signed)
Immediate Anesthesia Transfer of Care Note  Patient: Joy Nichols  Procedure(s) Performed: CESAREAN SECTION  Patient Location: PACU  Anesthesia Type:Epidural  Level of Consciousness: awake, alert  and oriented  Airway & Oxygen Therapy: Patient Spontanous Breathing  Post-op Assessment: Report given to RN and Post -op Vital signs reviewed and stable  Post vital signs: Reviewed and stable  Last Vitals:  Vitals Value Taken Time  BP 121/56 03/18/22 1227  Temp    Pulse 96 03/18/22 1230  Resp 16 03/18/22 1230  SpO2 94 % 03/18/22 1230  Vitals shown include unvalidated device data.  Last Pain:  Vitals:   03/18/22 1002  TempSrc: Oral  PainSc:          Complications: No notable events documented.

## 2022-03-18 NOTE — Op Note (Signed)
Hey thanks for calling back8/24/2023 - 03/18/2022   12:06 PM   PATIENT:  Joy Nichols  31 y.o. female   PRE-OPERATIVE DIAGNOSIS:  Primary Cesarean Section for Arrest of Descent   POST-OPERATIVE DIAGNOSIS:  Primary Cesarean Section for Arrest of Descent   PROCEDURE:  Procedure(s): CESAREAN SECTION (N/A) Low-transverse cesarean section with 2 layer closure   SURGEON:  Surgeon(s) and Role:    Noland Fordyce, MD - Primary   PHYSICIAN ASSISTANT:    ASSISTANTS: Dorisann Frames, CNM   ANESTHESIA:   epidural   EBL: Per nursing notes   BLOOD ADMINISTERED:none   DRAINS: Urinary Catheter (Foley)    LOCAL MEDICATIONS USED:  NONE   SPECIMEN: Placenta to labor and delivery for disposal   DISPOSITION OF SPECIMEN:  N/A   COUNTS:  YES   TOURNIQUET:  * No tourniquets in log *   DICTATION: .Note written in EPIC   PLAN OF CARE: Admit to inpatient    PATIENT DISPOSITION:  PACU - hemodynamically stable.   Delay start of Pharmacological VTE agent (>24hrs) due to surgical blood loss or risk of bleeding: yes   Findings:  @BABYSEXEBC @ infant,  APGAR (1 MIN):   APGAR (5 MINS):   APGAR (10 MINS):   Normal uterus, tubes and ovaries, normal placenta. 3VC, clear amniotic fluid  EBL: Per nursing notes cc Antibiotics:   2g Ancef Complications: none  Indications: This is a 31 y.o. year-old, G1 at [redacted]w[redacted]d admitted for induction of labor at term.  Patient had excessive bleeding with attempted placement of the cervical Foley bulb which was then rapidly removed.  Given the bleeding decision was made to start Pitocin.  Patient entered active labor and had good progress to full dilation .  With Pitocin.  She did receive an early epidural.  After full dilation she was several hours for passive descent and then pushed for over 3 hours with worsening hematuria and no change in fetal station.  Risks benefits and alternatives of the procedure were discussed with the patient who agreed to  proceed  Procedure:  After informed consent was obtained the patient was taken to the operating room where epidural anesthesia was found to be adequate.  She was prepped and draped in the normal sterile fashion in dorsal supine position with a leftward tilt.  A foley catheter was in place.  A Pfannenstiel skin incision was made 2 cm above the pubic symphysis in the midline with the scalpel.  Dissection was carried down with the Bovie cautery until the fascia was reached. The fascia was incised in the midline. The incision was extended laterally with the Mayo scissors. The inferior aspect of the fascial incision was grasped with the Coker clamps, elevated up and the underlying rectus muscles were dissected off sharply. The superior aspect of the fascial incision was grasped with the Coker clamps elevated up and the underlying rectus muscles were dissected off sharply.  The peritoneum was entered sharply. The peritoneal incision was extended superiorly and inferiorly with good visualization of the bladder. The bladder blade was inserted and palpation was done to assess the fetal position and the location of the uterine vessels. The lower segment of the uterus was incised sharply with the scalpel and extended  bluntly in the cephalo-caudal fashion. The infant was grasped, brought to the incision,  rotated and the infant was delivered with fundal pressure. The nose and mouth were bulb suctioned. The cord was clamped and cut after 1 minute delay. The infant was  handed off to the waiting pediatrician. The placenta was expressed. The uterus was exteriorized. The uterus was cleared of all clots and debris. The uterine incision was repaired with 0 Vicryl in a running locked fashion.  A second layer of the same suture was used in an imbricating fashion to obtain excellent hemostasis.  The uterus was then returned to the abdomen, the gutters were cleared of all clots and debris. The uterine incision was reinspected and found  to be hemostatic. The peritoneum was grasped and closed with 2-0 Vicryl in a running fashion. The cut muscle edges and the underside of the fascia were inspected and found to be hemostatic. The fascia was closed with 0 Vicryl in a single layer . The subcutaneous tissue was irrigated. Scarpa's layer was closed with a 2-0 plain gut suture. The skin was closed with a 4-0 Monocryl in a single layer. The patient tolerated the procedure well. Sponge lap and needle counts were correct x3 and patient was taken to the recovery room in a stable condition.  Lendon Colonel 03/18/2022 12:09 PM

## 2022-03-18 NOTE — Anesthesia Postprocedure Evaluation (Signed)
Anesthesia Post Note  Patient: Joy Nichols  Procedure(s) Performed: CESAREAN SECTION     Patient location during evaluation: PACU Anesthesia Type: Epidural Level of consciousness: awake and alert Pain management: pain level controlled Vital Signs Assessment: post-procedure vital signs reviewed and stable Respiratory status: spontaneous breathing, nonlabored ventilation and respiratory function stable Cardiovascular status: blood pressure returned to baseline and stable Postop Assessment: no apparent nausea or vomiting Anesthetic complications: no   No notable events documented.  Last Vitals:  Vitals:   03/18/22 1415 03/18/22 1418  BP:  112/80  Pulse:  68  Resp: 16   Temp: 36.9 C   SpO2:  95%    Last Pain:  Vitals:   03/18/22 1415  TempSrc: Oral  PainSc:    Pain Goal:    LLE Motor Response: Non-purposeful movement (03/18/22 1400) LLE Sensation: No pain (03/18/22 1400) RLE Motor Response: Purposeful movement (03/18/22 1400) RLE Sensation: No pain (03/18/22 1400)     Epidural/Spinal Function Cutaneous sensation: Tingles (03/18/22 1400), Patient able to flex knees: No (03/18/22 1400), Patient able to lift hips off bed: No (03/18/22 1400), Back pain beyond tenderness at insertion site: No (03/18/22 1400), Progressively worsening motor and/or sensory loss: No (03/18/22 1400), Bowel and/or bladder incontinence post epidural: No (03/18/22 1400)  Lowella Curb

## 2022-03-18 NOTE — Progress Notes (Signed)
Patient was sleeping soundly with lights dim and her husband at bedside in chair, alert and holding baby. Rounding and report with plans to give meds. And assess patient. Patient did not awaken when staff entered room. Husband states, "she has not slept in 36 hours and requested that she not be disturbed and he would call when she wakes up."

## 2022-03-18 NOTE — Lactation Note (Signed)
This note was copied from a baby's chart. Lactation Consultation Note  Patient Name: Joy Nichols [id number] Date: 03/18/2022   Age 33 hrs LC attempted to see birth parent to assess latch. Birth parent and infant sleeping.  Support person awake and will call for LC with next feeding.   Maternal Data    Feeding    LATCH Score                    Lactation Tools Discussed/Used    Interventions    Discharge    Consult Status      Joy Nichols  [name] 03/18/2022, 8:00 PM

## 2022-03-19 LAB — CBC
HCT: 30.9 % — ABNORMAL LOW (ref 36.0–46.0)
Hemoglobin: 11 g/dL — ABNORMAL LOW (ref 12.0–15.0)
MCH: 34.9 pg — ABNORMAL HIGH (ref 26.0–34.0)
MCHC: 35.6 g/dL (ref 30.0–36.0)
MCV: 98.1 fL (ref 80.0–100.0)
Platelets: 183 10*3/uL (ref 150–400)
RBC: 3.15 MIL/uL — ABNORMAL LOW (ref 3.87–5.11)
RDW: 13.7 % (ref 11.5–15.5)
WBC: 16.8 10*3/uL — ABNORMAL HIGH (ref 4.0–10.5)
nRBC: 0 % (ref 0.0–0.2)

## 2022-03-19 NOTE — Progress Notes (Signed)
   Subjective: POD# 1 Live born female  Birth Weight: 7 lb 15.7 oz (3620 g) APGAR: 8, 10  Newborn Delivery   Birth date/time: 03/18/2022 11:40:00 Delivery type: C-Section, Low Transverse Trial of labor: Yes C-section categorization: Primary     Baby name: Joy Nichols  Delivering provider: Noland Fordyce   Feeding: breast  Pain control at delivery: Epidural   Reports feeling well with no complaints.  Patient reports tolerating PO.   Pain controlled with prescription NSAID's including ketorolac (Toradol) Denies HA/SOB/C/P/N/V/dizziness. She reports vaginal bleeding as normal, without clots. She is ambulating and urinating without difficulty.     Objective:  Vitals:   03/18/22 1930 03/19/22 0012 03/19/22 0300 03/19/22 0500  BP: 118/78 107/75 108/74 108/74  Pulse: 69 69 75 75  Resp: 17 16 17 16   Temp: 98.4 F (36.9 C) 98 F (36.7 C) 98 F (36.7 C) 98 F (36.7 C)  TempSrc:  Oral Oral   SpO2:      Weight:      Height:        Intake/Output Summary (Last 24 hours) at 03/19/2022 1219 Last data filed at 03/19/2022 0527 Gross per 24 hour  Intake --  Output 2648 ml  Net -2648 ml      Recent Labs    03/17/22 1504 03/19/22 0532  WBC 8.1 16.8*  HGB 13.3 11.0*  HCT 37.1 30.9*  PLT 194 183    Blood type: --/--/AB POS (08/24 1450)  Rubella: Nonimmune (02/03 0000)   Physical Exam:  General: alert and cooperative CV: Regular rate and rhythm Resp: clear Abdomen: soft, nontender, normal bowel sounds Incision: clean, dry, and intact Uterine Fundus: firm, below umbilicus, nontender Lochia: minimal Ext: extremities normal, atraumatic, no cyanosis or edema  Assessment/Plan: 31 y.o.   POD# 1. G1P1001                  Principal Problem:   Postpartum care following cesarean delivery 8/25  Encourage rest when baby rests Breastfeeding support Encourage to ambulate Routine post-op care Active Problems:   Encounter for induction of labor   Failed induction of labor -  arrest of descent   Status post primary low transverse cesarean section  Anticipate discharge tomorrow.   9/25, CNM, MSN 03/19/2022, 12:19 PM

## 2022-03-19 NOTE — Progress Notes (Signed)
Call to [facility] to report that patient has continued to have painful latch. LC reports to RN that she will be in shortly as she has just entered another room. RN spent over 80 minutes at bedside helping with latch and breastfeeding, hand expression and spoon feeding. Parent's nipples are cracked and bleeding and painful. Mother expresses gratitude for assistance.

## 2022-03-20 MED ORDER — IBUPROFEN 600 MG PO TABS: 600.0000 mg | ORAL_TABLET | Freq: Four times a day (QID) | ORAL | 0 refills | Status: DC

## 2022-03-20 MED ORDER — MEASLES, MUMPS & RUBELLA VAC IJ SOLR
0.5000 mL | Freq: Once | INTRAMUSCULAR | Status: AC
  Administered 2022-03-20: 0.5 mL via SUBCUTANEOUS
  Filled 2022-03-20: qty 0.5

## 2022-03-20 MED ORDER — ACETAMINOPHEN 500 MG PO TABS: 1000.0000 mg | ORAL_TABLET | Freq: Four times a day (QID) | ORAL | 0 refills | Status: DC

## 2022-03-20 MED ORDER — OXYCODONE HCL 5 MG PO TABS: 5.0000 mg | ORAL_TABLET | ORAL | 0 refills | Status: DC | PRN

## 2022-03-20 NOTE — Lactation Note (Signed)
This note was copied from a baby's chart. Lactation Consultation Note  Patient Name: Joy Nichols [id number] Date: 03/20/2022 Reason for consult: Follow-up assessment;Term;Primapara;1st time breastfeeding Age:33 hours   P1: Term infant at 40+1 weeks Feeding preference: Breast (Using formula as supplementation until milk comes to volume)  Birth parent requests a latch assist prior to discharge.  "[name]" was asleep in the bassinet, however, it had been approximately 4 hours since her last formula feeding.  Support person awakened her for latching.  Breast feeding basics reviewed.  Observed birth parent doing hand expression.  Birth parent has very sensitive nipples due to some previous poor latches.  She has pumped and rested her nipples for the last two feeding sessions.  Both nipples are reddened and have small scabs; no cracking or bleeding.  Birth parent is using her own personal nipple ointment.  Assisted to latch "[name]" easily in the cross cradle hold.  Reviewed positioning, body alignment, obtaining a deep latch, gentle stimulation and breast compressions.  Observed her feeding for 25 minutes.  Birth parent was able to latch to the alternate breast independently and fed for an additional 5 minutes.  Support person following up with formula supplementation.  Provided [employer] employee pump.  Receipt given and all paperwork placed in basket in the office.  Parents appreciative of assistance; currently awaiting pediatrician's discharge order.  RN updated.   Maternal Data    Feeding Mother's Current Feeding Choice: Breast Milk and Formula  LATCH Score Latch: Grasps breast easily, tongue down, lips flanged, rhythmical sucking.  Audible Swallowing: A few with stimulation  Type of Nipple: Everted at rest and after stimulation  Comfort (Breast/Nipple): Filling, red/small blisters or bruises, mild/mod discomfort  Hold (Positioning): Assistance needed to correctly position infant at  breast and maintain latch.  LATCH Score: 7   Lactation Tools Discussed/Used    Interventions Interventions: Breast feeding basics reviewed;Assisted with latch;Skin to skin;Breast massage;Hand express;Breast compression;Hand pump;Expressed milk;Support pillows;Position options;Adjust position;DEBP;Education  Discharge Discharge Education: Engorgement and breast care Pump: Employee Pump (Hands Free Pump) WIC Program: No  Consult Status Consult Status: Complete Date: 03/20/22 Follow-up type: Call as needed    Chellsea Beckers R Amaris Delafuente 03/20/2022, 1:00 PM

## 2022-03-20 NOTE — Progress Notes (Signed)
   Subjective: POD# 2 Live born female  Birth Weight: 7 lb 15.7 oz (3620 g) APGAR: 8, 10  Newborn Delivery   Birth date/time: 03/18/2022 11:40:00 Delivery type: C-Section, Low Transverse Trial of labor: Yes C-section categorization: Primary     Baby name: Joy Nichols  Delivering provider: Noland Fordyce   Feeding: breast  Pain control at delivery: Epidural   Reports feeling exhausted. Having significant nipple pain from latching. Wanting to stay to work with lactation.   Patient reports tolerating PO.   Pain controlled with prescription NSAID's including ketorolac (Toradol) Denies HA/SOB/C/P/N/V/dizziness. She reports vaginal bleeding as normal, without clots. She is ambulating and urinating without difficulty.     Objective:  Vitals:   03/19/22 0500 03/19/22 1624 03/19/22 2110 03/20/22 0500  BP: 108/74 106/82 119/88 120/79  Pulse: 75 76 83 80  Resp: 16 16 18 16   Temp: 98 F (36.7 C) 98.7 F (37.1 C) 98.1 F (36.7 C) 98 F (36.7 C)  TempSrc:  Oral Oral Oral  SpO2:  100% 100% 100%  Weight:      Height:        Intake/Output Summary (Last 24 hours) at 03/20/2022 1130 Last data filed at 03/20/2022 0600 Gross per 24 hour  Intake 1000 ml  Output 350 ml  Net 650 ml      Recent Labs    03/17/22 1504 03/19/22 0532  WBC 8.1 16.8*  HGB 13.3 11.0*  HCT 37.1 30.9*  PLT 194 183    Blood type: --/--/AB POS (08/24 1450)  Rubella: Nonimmune (02/03 0000)   Physical Exam:  General: alert and cooperative CV: Regular rate and rhythm Resp: clear Abdomen: soft, nontender, normal bowel sounds Incision: clean, dry, and intact Uterine Fundus: firm, below umbilicus, nontender Lochia: minimal Ext: extremities normal, atraumatic, no cyanosis or edema  Assessment/Plan: 31 y.o.   POD# 2. G1P1001                  Principal Problem:   Postpartum care following cesarean delivery 8/25  Encourage rest when baby rests Breastfeeding support Encourage to ambulate Routine post-op  care Active Problems:   Encounter for induction of labor   Failed induction of labor - arrest of descent   Status post primary low transverse cesarean section  Anticipate discharge later today or tomorrow to allow for lactation support.   9/25, CNM, MSN 03/20/2022, 11:30 AM

## 2022-03-20 NOTE — Lactation Note (Signed)
This note was copied from a baby's chart. Lactation Consultation Note  Patient Name: Joy Nichols [id number] Date: 03/20/2022 Reason for consult: Follow-up assessment;Nipple pain/trauma;RN request Age:33 hours   Parent exhausted and has not had any good stretch of sleep.  She is in pain due to damage from latching.    Right nipple has bruised and blistered compression stripe with scabbing. Left nipple has multiple blisters with scabbing.   Baby has a very tight clenched mouth.  Placed belly to belly with mom.  NS placed.  Baby latch, gape narrow, chomping noted.   Mom had too much pain to continue feeding.  LC suggested pumping with lowest vacuum setting and hand expressing.  DEBP reviewed.  24 flanges were most comfortable for her but 21 appeared a good fit. Both left in room.  Parent will use the 24's.    Encouraged supplementing with DBM using a bottle to give nipples a rest. Birthing Parent will sleep then wake and pump and other parent will bottle feed.      Maternal Data Has patient been taught Hand Expression?: Yes  Feeding Mother's Current Feeding Choice: Breast Milk and Donor Milk  LATCH Score Latch: Grasps breast easily, tongue down, lips flanged, rhythmical sucking.  Audible Swallowing: None  Type of Nipple: Everted at rest and after stimulation  Comfort (Breast/Nipple): Engorged, cracked, bleeding, large blisters, severe discomfort  Hold (Positioning): Assistance needed to correctly position infant at breast and maintain latch.  LATCH Score: 5   Lactation Tools Discussed/Used Tools: Pump;Comfort gels;Nipple Shields Nipple shield size: 24 Breast pump type: Double-Electric Breast Pump Pump Education: Setup, frequency, and cleaning;Milk Storage Reason for Pumping: give nipples a rest/ stimulate supply Pumping frequency: encouraged to pump when baby feeds  Interventions Interventions: Breast feeding basics reviewed;Assisted with latch;Skin to  skin;Education;LC Services brochure;Comfort gels;DEBP  Discharge Discharge Education: Outpatient recommendation (encouraged follow up with [facility] or [facility]) Pump: DEBP;Employee Pump  Consult Status Consult Status: Follow-up Date: 03/21/22 Follow-up type: In-patient    Joy Nichols [name] 03/20/2022, 1:12 AM

## 2022-03-20 NOTE — Discharge Summary (Signed)
OB Discharge Summary  Patient Name: Joy Nichols DOB: [birth year] MRN: [id number]  Date of admission: 03/17/2022 Delivering provider: Noland Fordyce   Admitting diagnosis: Encounter for induction of labor [Z34.90] Intrauterine pregnancy: [redacted]w[redacted]d     Secondary diagnosis: Patient Active Problem List   Diagnosis Date Noted   Failed induction of labor - arrest of descent 03/18/2022   Status post primary low transverse cesarean section 03/18/2022   Postpartum care following cesarean delivery 8/25 03/18/2022   Encounter for induction of labor 03/17/2022    Date of discharge: 03/20/2022   Discharge diagnosis: Principal Problem:   Postpartum care following cesarean delivery 8/25 Active Problems:   Encounter for induction of labor   Failed induction of labor - arrest of descent   Status post primary low transverse cesarean section                                                            Augmentation: AROM, Pitocin, and IP Foley Pain control: Epidural  Laceration:None  Complications: None  Hospital course:  Induction of Labor With Cesarean Section   31 y.o. yo G1P1001 at [redacted]w[redacted]d was admitted to the hospital 03/17/2022 for induction of labor. Patient had a labor course significant for pushing for 3 hours. The patient went for cesarean section due to Arrest of Descent. Delivery details are as follows: Membrane Rupture Time/Date: 7:17 AM ,03/18/2022   Delivery Method:C-Section, Low Transverse  Details of operation can be found in separate operative Note.  Patient had an uncomplicated postpartum course. She is ambulating, tolerating a regular diet, passing flatus, and urinating well.  Patient is discharged home in stable condition on 03/20/22.      Newborn Data: Birth date:03/18/2022  Birth time:11:40 AM  Gender:Female  Living status:Living  Apgars:8 ,10  Weight:3620 g                                Physical exam  Vitals:   03/19/22 0500 03/19/22 1624 03/19/22 2110 03/20/22 0500  BP:  108/74 106/82 119/88 120/79  Pulse: 75 76 83 80  Resp: 16 16 18 16   Temp: 98 F (36.7 C) 98.7 F (37.1 C) 98.1 F (36.7 C) 98 F (36.7 C)  TempSrc:  Oral Oral Oral  SpO2:  100% 100% 100%  Weight:      Height:       General: alert, cooperative, and no distress Lochia: appropriate Uterine Fundus: firm Incision: Healing well with no significant drainage, Dressing is clean, dry, and intact DVT Evaluation: No evidence of DVT seen on physical exam.  Labs: Lab Results  Component Value Date   WBC 16.8 (H) 03/19/2022   HGB 11.0 (L) 03/19/2022   HCT 30.9 (L) 03/19/2022   MCV 98.1 03/19/2022   PLT 183 03/19/2022      Latest Ref Rng & Units 03/17/2022    3:04 PM  CMP  Glucose 70 - 99 mg/dL 88   BUN 6 - 20 mg/dL 11   Creatinine 03/19/2022 - 1.00 mg/dL 4.09   Sodium 8.11 - 914 mmol/L 135   Potassium 3.5 - 5.1 mmol/L 3.8   Chloride 98 - 111 mmol/L 110   CO2 22 - 32 mmol/L 18   Calcium 8.9 - 10.3 mg/dL  9.3   Total Protein 6.5 - 8.1 g/dL 6.5   Total Bilirubin 0.3 - 1.2 mg/dL 0.9   Alkaline Phos 38 - 126 U/L 167   AST 15 - 41 U/L 19   ALT 0 - 44 U/L 15       03/19/2022    9:21 PM  Edinburgh Postnatal Depression Scale Screening Tool  I have been able to laugh and see the funny side of things. 0  I have looked forward with enjoyment to things. 0  I have blamed myself unnecessarily when things went wrong. 0  I have been anxious or worried for no good reason. 0  I have felt scared or panicky for no good reason. 0  Things have been getting on top of me. 0  I have been so unhappy that I have had difficulty sleeping. 0  I have felt sad or miserable. 0  I have been so unhappy that I have been crying. 0  The thought of harming myself has occurred to me. 0  Edinburgh Postnatal Depression Scale Total 0   Discharge instructions:  per After Visit Summary  After Visit Meds:  Allergies as of 03/20/2022   No Known Allergies      Medication List     STOP taking these medications     acetaZOLAMIDE 125 MG tablet Commonly known as: DIAMOX       TAKE these medications    acetaminophen 500 MG tablet Commonly known as: TYLENOL Take 2 tablets (1,000 mg total) by mouth every 6 (six) hours.   ibuprofen 600 MG tablet Commonly known as: ADVIL Take 1 tablet (600 mg total) by mouth every 6 (six) hours.   oxyCODONE 5 MG immediate release tablet Commonly known as: Oxy IR/ROXICODONE Take 1-2 tablets (5-10 mg total) by mouth every 4 (four) hours as needed for moderate pain.   prenatal multivitamin Tabs tablet Take 1 tablet by mouth daily.       Activity: Advance as tolerated. Pelvic rest for 6 weeks.   Newborn Data: Live born female  Birth Weight: 7 lb 15.7 oz (3620 g) APGAR: 8, 10  Newborn Delivery   Birth date/time: 03/18/2022 11:40:00 Delivery type: C-Section, Low Transverse Trial of labor: Yes C-section categorization: Primary      Named [name] Baby Feeding: Breast Disposition:home with mother  Delivery Report:  Review the Delivery Report for details.    Follow up:  Follow-up Information     Noland Fordyce, MD. Schedule an appointment as soon as possible for a visit in 6 week(s).   Specialty: Obstetrics and Gynecology Contact information: [address] [place] [address] [phone]                Clancy Gourd, MSN 03/20/2022, 2:56 PM

## 2022-03-25 ENCOUNTER — Telehealth (HOSPITAL_COMMUNITY): Payer: Self-pay | Admitting: *Deleted

## 2022-03-25 NOTE — Telephone Encounter (Signed)
Left phone voicemail message.  Duffy Rhody, RN 03-25-2022 at 4:05pm

## 2022-03-29 ENCOUNTER — Other Ambulatory Visit (HOSPITAL_COMMUNITY): Payer: Self-pay

## 2022-03-29 MED ORDER — CEPHALEXIN 500 MG PO CAPS
500.0000 mg | ORAL_CAPSULE | Freq: Four times a day (QID) | ORAL | 0 refills | Status: DC
  Filled 2022-03-29 (×2): qty 28, 7d supply, fill #0

## 2022-03-29 MED ORDER — CEPHALEXIN 500 MG PO CAPS
500.0000 mg | ORAL_CAPSULE | Freq: Two times a day (BID) | ORAL | 0 refills | Status: DC
  Filled 2022-03-29: qty 14, 7d supply, fill #0

## 2022-03-31 ENCOUNTER — Other Ambulatory Visit (HOSPITAL_COMMUNITY): Payer: Self-pay

## 2022-05-09 ENCOUNTER — Other Ambulatory Visit (HOSPITAL_COMMUNITY): Payer: Self-pay

## 2022-05-09 MED FILL — Dicloxacillin Sodium Cap 500 MG: ORAL | 10 days supply | Qty: 40 | Fill #0 | Status: CN

## 2022-05-10 ENCOUNTER — Other Ambulatory Visit (HOSPITAL_COMMUNITY): Payer: Self-pay

## 2022-05-10 MED ORDER — CEPHALEXIN 500 MG PO CAPS
500.0000 mg | ORAL_CAPSULE | Freq: Two times a day (BID) | ORAL | 0 refills | Status: DC
  Filled 2022-05-10: qty 14, 7d supply, fill #0

## 2022-08-22 ENCOUNTER — Other Ambulatory Visit (HOSPITAL_COMMUNITY): Payer: Self-pay

## 2022-08-22 ENCOUNTER — Ambulatory Visit (INDEPENDENT_AMBULATORY_CARE_PROVIDER_SITE_OTHER): Payer: 59 | Admitting: Family Medicine

## 2022-08-22 VITALS — BP 120/80 | HR 78 | Temp 97.9°F | Ht 69.0 in | Wt 126.1 lb

## 2022-08-22 DIAGNOSIS — J029 Acute pharyngitis, unspecified: Secondary | ICD-10-CM

## 2022-08-22 MED ORDER — AMOXICILLIN 500 MG PO CAPS
500.0000 mg | ORAL_CAPSULE | Freq: Three times a day (TID) | ORAL | 0 refills | Status: AC
  Filled 2022-08-22: qty 30, 10d supply, fill #0

## 2022-08-22 NOTE — Progress Notes (Signed)
Acute Office Visit  Subjective:     Patient ID: Joy Nichols, female    DOB: 10/20/1990, 32 y.o.   MRN: [id number]  Chief Complaint  Patient presents with  . Sore Throat    X5 days, tried Ibuprofen and cough drops with some relief    Pt is reporting 5 day history of sore throat, states that she saw white spots on her tonsils, no fever or chills, states that she has not had any nasal congestion, hurts to swallow but otherwise no other associated symptoms.    Review of Systems  All other systems reviewed and are negative.       Objective:    BP 120/80 (BP Location: Left Arm, Patient Position: Sitting, Cuff Size: Normal)   Pulse 78   Temp 97.9 F (36.6 C) (Oral)   Ht 5\' 9"  (1.753 m)   Wt 126 lb 1.6 oz (57.2 kg)   LMP 08/22/2021 (Approximate)   SpO2 99%   Breastfeeding Yes   BMI 18.62 kg/m  {Vitals History (Optional):23777}  Physical Exam Vitals reviewed.  Constitutional:      Appearance: Normal appearance. She is well-groomed and normal weight.  HENT:     Right Ear: Tympanic membrane normal.     Left Ear: Tympanic membrane normal.     Nose: No congestion.     Mouth/Throat:     Mouth: Mucous membranes are moist.     Pharynx: Posterior oropharyngeal erythema present.     Tonsils: Tonsillar exudate present. 1+ on the right.  Eyes:     Conjunctiva/sclera: Conjunctivae normal.  Neck:     Thyroid: No thyromegaly.  Cardiovascular:     Rate and Rhythm: Normal rate and regular rhythm.     Pulses: Normal pulses.     Heart sounds: S1 normal and S2 normal.  Pulmonary:     Effort: Pulmonary effort is normal.     Breath sounds: Normal breath sounds and air entry.  Abdominal:     General: Bowel sounds are normal.  Musculoskeletal:     Right lower leg: No edema.     Left lower leg: No edema.  Neurological:     Mental Status: She is alert and oriented to person, place, and time. Mental status is at baseline.     Gait: Gait is intact.  Psychiatric:        Mood and  Affect: Mood and affect normal.        Speech: Speech normal.        Behavior: Behavior normal.        Judgment: Judgment normal.    No results found for any visits on 08/22/22.      Assessment & Plan:   Problem List Items Addressed This Visit   None Visit Diagnoses     Sore throat    -  Primary   Relevant Medications   amoxicillin (AMOXIL) 500 MG capsule   Other Relevant Orders   POC COVID-19   POC Rapid Strep A     Her strep screen is negative however she has assymetrical swelling in the back of her throat, worse on the right, I advised that if she does not   Meds ordered this encounter  Medications  . amoxicillin (AMOXIL) 500 MG capsule    Sig: Take 1 capsule (500 mg total) by mouth 3 (three) times daily for 10 days.    Dispense:  30 capsule    Refill:  0    Return for annual physical  exam-- please change her appt to after 3./17.  Farrel Conners, MD

## 2022-08-23 LAB — POC COVID19 BINAXNOW: SARS Coronavirus 2 Ag: NEGATIVE

## 2022-08-23 LAB — POCT RAPID STREP A (OFFICE): Rapid Strep A Screen: NEGATIVE

## 2022-09-15 ENCOUNTER — Ambulatory Visit (INDEPENDENT_AMBULATORY_CARE_PROVIDER_SITE_OTHER): Payer: 59 | Admitting: Family Medicine

## 2022-09-15 ENCOUNTER — Encounter: Payer: Self-pay | Admitting: Family Medicine

## 2022-09-15 VITALS — BP 98/60 | HR 61 | Temp 97.7°F | Ht 69.0 in | Wt 126.8 lb

## 2022-09-15 DIAGNOSIS — R17 Unspecified jaundice: Secondary | ICD-10-CM | POA: Diagnosis not present

## 2022-09-15 DIAGNOSIS — Z83438 Family history of other disorder of lipoprotein metabolism and other lipidemia: Secondary | ICD-10-CM

## 2022-09-15 DIAGNOSIS — Z131 Encounter for screening for diabetes mellitus: Secondary | ICD-10-CM | POA: Diagnosis not present

## 2022-09-15 DIAGNOSIS — E559 Vitamin D deficiency, unspecified: Secondary | ICD-10-CM

## 2022-09-15 DIAGNOSIS — Z13220 Encounter for screening for lipoid disorders: Secondary | ICD-10-CM | POA: Diagnosis not present

## 2022-09-15 DIAGNOSIS — Z0000 Encounter for general adult medical examination without abnormal findings: Secondary | ICD-10-CM

## 2022-09-15 LAB — COMPREHENSIVE METABOLIC PANEL
ALT: 19 U/L (ref 0–35)
AST: 17 U/L (ref 0–37)
Albumin: 4.7 g/dL (ref 3.5–5.2)
Alkaline Phosphatase: 68 U/L (ref 39–117)
BUN: 13 mg/dL (ref 6–23)
CO2: 30 mEq/L (ref 19–32)
Calcium: 9.7 mg/dL (ref 8.4–10.5)
Chloride: 103 mEq/L (ref 96–112)
Creatinine, Ser: 0.58 mg/dL (ref 0.40–1.20)
GFR: 119.1 mL/min (ref >60.00)
Glucose, Bld: 69 mg/dL — ABNORMAL LOW (ref 70–99)
Potassium: 3.9 mEq/L (ref 3.5–5.1)
Sodium: 140 mEq/L (ref 135–145)
Total Bilirubin: 1.5 mg/dL — ABNORMAL HIGH (ref 0.2–1.2)
Total Protein: 7.5 g/dL (ref 6.0–8.3)

## 2022-09-15 LAB — LIPID PANEL
Cholesterol: 148 mg/dL (ref 0–200)
HDL: 57.1 mg/dL (ref >39.00)
LDL Cholesterol: 81 mg/dL (ref 0–99)
NonHDL: 90.84
Total CHOL/HDL Ratio: 3
Triglycerides: 51 mg/dL (ref 0.0–149.0)
VLDL: 10.2 mg/dL (ref 0.0–40.0)

## 2022-09-15 LAB — HEMOGLOBIN A1C: Hgb A1c MFr Bld: 5.5 % (ref 4.6–6.5)

## 2022-09-15 LAB — VITAMIN D 25 HYDROXY (VIT D DEFICIENCY, FRACTURES): VITD: 44.9 ng/mL (ref 30.00–100.00)

## 2022-09-15 NOTE — Progress Notes (Signed)
Complete physical exam  Patient: Joy Nichols   DOB: 20-Oct-1990   32 y.o. Female  MRN: [id number]  Subjective:    Chief Complaint  Patient presents with   [place] is a 32 y.o. female who presents today for a complete physical exam. She reports consuming a general diet. Home exercise routine includes walking 0.5  hrs per day. She generally feels well. She reports sleeping fairly well. She does not have additional problems to discuss today.    Most recent fall risk assessment:    10/08/2021   11:29 AM  [place] in the past year? 0  Number falls in past yr: 0  Injury with Fall? 0  Risk for fall due to : No Fall Risks  Follow up Falls evaluation completed     Most recent depression screenings:    08/22/2022   11:33 AM 10/08/2021   11:29 AM  PHQ 2/9 Scores  PHQ - 2 Score 0 0  PHQ- 9 Score 1 0    Vision:Not within last year  and Dental: No current dental problems and Receives regular dental care  Patient Active Problem List   Diagnosis Date Noted   Failed induction of labor - arrest of descent 03/18/2022   Status post primary low transverse cesarean section 03/18/2022   Postpartum care following cesarean delivery 8/25 03/18/2022   Encounter for induction of labor 03/17/2022      Patient Care Team: Farrel Conners, MD as PCP - General (Family Medicine) Aloha Gell, MD as Consulting Physician (Obstetrics and Gynecology) Ulla Gallo, MD as Consulting Physician (Dermatology)   Outpatient Medications Prior to Visit  Medication Sig   Prenatal Vit-Fe Fumarate-FA (PRENATAL MULTIVITAMIN) TABS tablet Take 1 tablet by mouth daily.   No facility-administered medications prior to visit.    Review of Systems  HENT:  Negative for hearing loss.   Eyes:  Negative for blurred vision.  Respiratory:  Negative for shortness of breath.   Cardiovascular:  Negative for chest pain.  Gastrointestinal: Negative.   Genitourinary:  Negative.   Musculoskeletal:  Negative for back pain.  Neurological:  Negative for headaches.  Psychiatric/Behavioral:  Negative for depression.           Objective:     BP 98/60 (BP Location: Left Arm, Patient Position: Sitting, Cuff Size: Normal)   Pulse 61   Temp 97.7 F (36.5 C) (Oral)   Ht '5\' 9"'$  (1.753 m)   Wt 126 lb 12.8 oz (57.5 kg)   LMP 08/22/2021 (Approximate)   SpO2 99%   Breastfeeding Yes   BMI 18.73 kg/m    Physical Exam Vitals reviewed.  Constitutional:      Appearance: Normal appearance. She is well-groomed and normal weight.  HENT:     Right Ear: Tympanic membrane and ear canal normal.     Left Ear: Tympanic membrane and ear canal normal.     Mouth/Throat:     Mouth: Mucous membranes are moist.     Pharynx: No posterior oropharyngeal erythema.  Eyes:     Conjunctiva/sclera: Conjunctivae normal.  Neck:     Thyroid: No thyromegaly.  Cardiovascular:     Rate and Rhythm: Normal rate and regular rhythm.     Pulses: Normal pulses.     Heart sounds: S1 normal and S2 normal.  Pulmonary:     Effort: Pulmonary effort is normal.     Breath sounds: Normal breath sounds and air entry.  Abdominal:     General: Bowel sounds are normal.  Musculoskeletal:     Right lower leg: No edema.     Left lower leg: No edema.  Lymphadenopathy:     Cervical: No cervical adenopathy.  Neurological:     Mental Status: She is alert and oriented to person, place, and time. Mental status is at baseline.     Gait: Gait is intact.  Psychiatric:        Mood and Affect: Mood and affect normal.        Speech: Speech normal.        Behavior: Behavior normal.        Judgment: Judgment normal.         Assessment & Plan:    Routine Health Maintenance and Physical Exam  Immunization History  Administered Date(s) Administered   HPV 9-valent 09/09/2005, 12/15/2005, 03/07/2006   Hepatitis A 10/23/2012, 12/24/2014   Hepatitis A, Adult 01/12/2015   Hepatitis B 01/17/2002,  03/05/2002, 06/04/2002   Influenza-Unspecified 04/23/2022   MMR 12/18/1990, 07/13/1994, 03/20/2022   Meningococcal Conjugate 01/31/2006   PFIZER(Purple Top)SARS-COV-2 Vaccination 08/02/2019, 03/24/2020, 07/14/2020, 03/25/2022   Tdap 01/30/2007, 01/12/2008, 05/28/2017   Typhoid Inactivated 10/23/2012, 11/23/2012, 08/11/2014   Unspecified SARS-COV-2 Vaccination 03/25/2021   Varicella 02/22/1990   Yellow Fever 07/01/2015    Health Maintenance  Topic Date Due   COVID-19 Vaccine (6 - 2023-24 season) 10/01/2022 (Originally 05/20/2022)   Hepatitis C Screening  09/16/2023 (Originally 07/09/2007)   PAP SMEAR-Modifier  04/25/2023   DTaP/Tdap/Td (4 - Td or Tdap) 05/29/2027   INFLUENZA VACCINE  Completed   HPV VACCINES  Completed   HIV Screening  Completed    Discussed health benefits of physical activity, and encouraged her to engage in regular exercise appropriate for her age and condition.  Problem List Items Addressed This Visit   None Visit Diagnoses     Preventative health care    -  Primary   Normal physical exam findings today. She is due for her annual labs which i have ordered under their respective diagnoses. RTC 1 year for annual exam   Elevated bilirubin       Relevant Orders   CMP (Completed)   Bilirubin, fractionated(tot/dir/indir) (Completed)   Family history of hyperlipidemia       Relevant Orders   Lipid Panel (Completed)   Lipid screening       Relevant Orders   Lipid Panel (Completed)   Vitamin D deficiency       Relevant Orders   Vitamin D, 25-hydroxy (Completed)   Screening for diabetes mellitus       Relevant Orders   Hemoglobin A1c (Completed)      Return in about 1 year (around 09/16/2023) for annual physical exam.     Farrel Conners, MD

## 2022-09-16 LAB — BILIRUBIN, FRACTIONATED(TOT/DIR/INDIR)
Bilirubin, Direct: 0.3 mg/dL — ABNORMAL HIGH (ref 0.0–0.2)
Indirect Bilirubin: 1.2 mg/dL (calc) (ref 0.2–1.2)
Total Bilirubin: 1.5 mg/dL — ABNORMAL HIGH (ref 0.2–1.2)

## 2022-10-20 DIAGNOSIS — Z01419 Encounter for gynecological examination (general) (routine) without abnormal findings: Secondary | ICD-10-CM | POA: Diagnosis not present

## 2023-01-04 IMAGING — CT CT CARDIAC CORONARY ARTERY CALCIUM SCORE
2 series · 15 of 20 positions shown, 17 images · non-contrast
Comparison: None.
COMPARISON: None.

Addendum:
EXAM:
OVER-READ INTERPRETATION  CT CHEST

The following report is an over-read performed by radiologist Dr.
Henning Juhl Janerka [REDACTED] on 10/27/2020. This
over-read does not include interpretation of cardiac or coronary
anatomy or pathology. The coronary calcium score interpretation by
the cardiologist is attached.
CLINICAL DATA: Risk stratification
Coronary Calcium Score
TECHNIQUE: The patient was scanned on a Siemens Somatom 64 slice scanner. Axial
non-contrast 3 mm slices were carried out through the heart. The
data set was analyzed on a dedicated work station and scored using
the Agatson method.

[Series 3: cascseq 2.0 b35f 70% · axial · 0.38mm/px · z∈[+1076,+1212]mm · 8 of 86 slices shown]
[im 9/86  vessel]
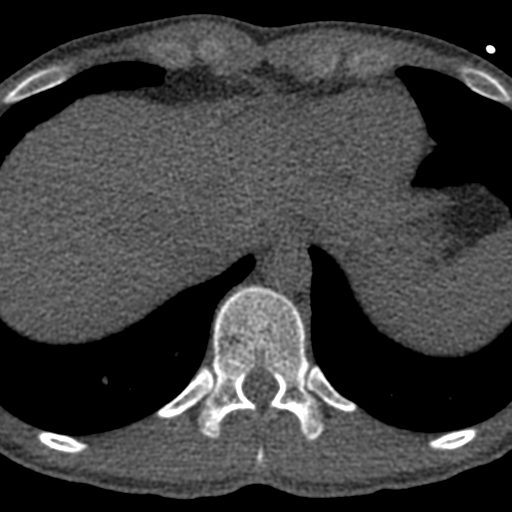
[im 18/86  vessel]
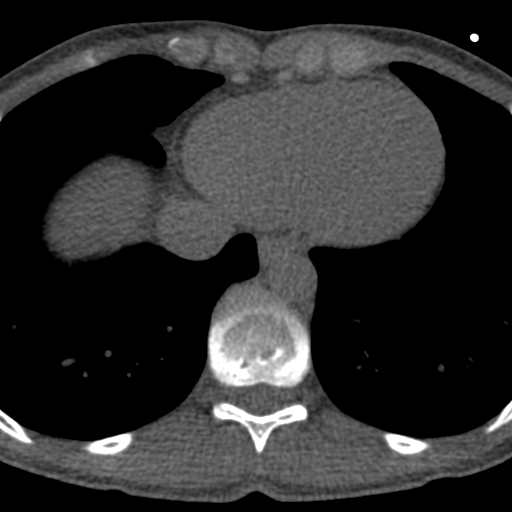
[im 26/86  vessel]
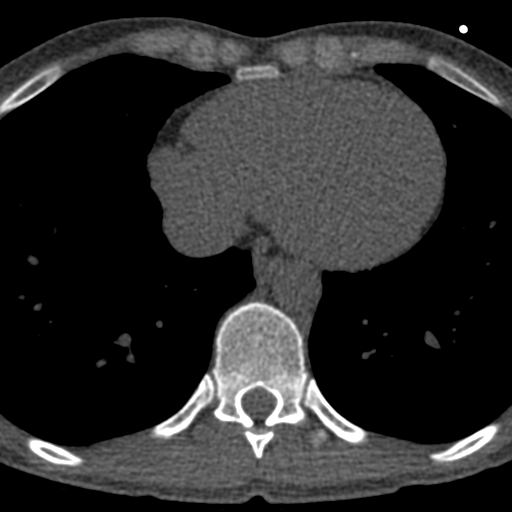
[im 35/86  vessel]
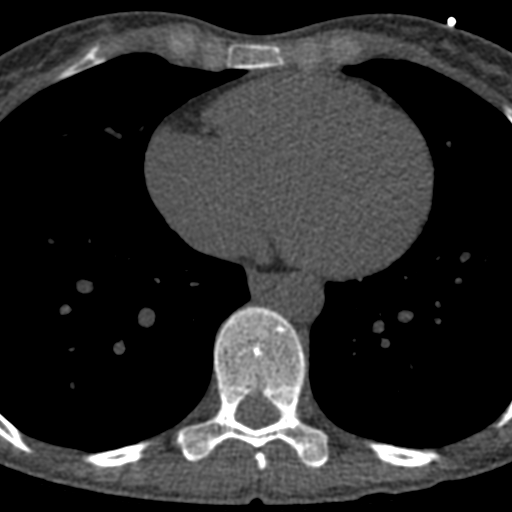
[im 52/86  vessel]
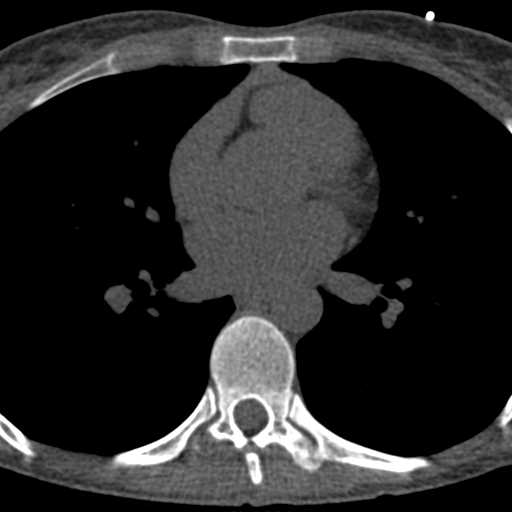
[im 60/86  vessel]
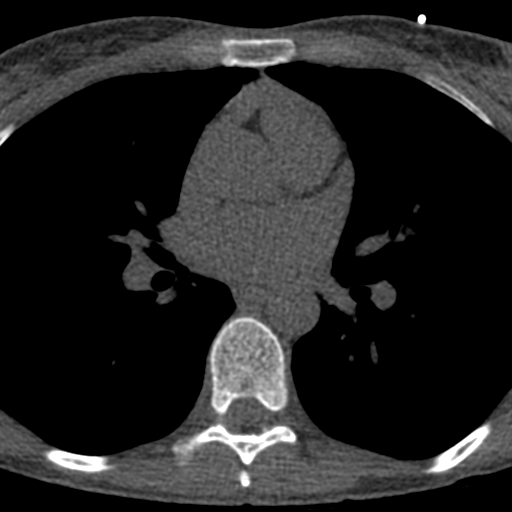
[im 69/86  vessel]
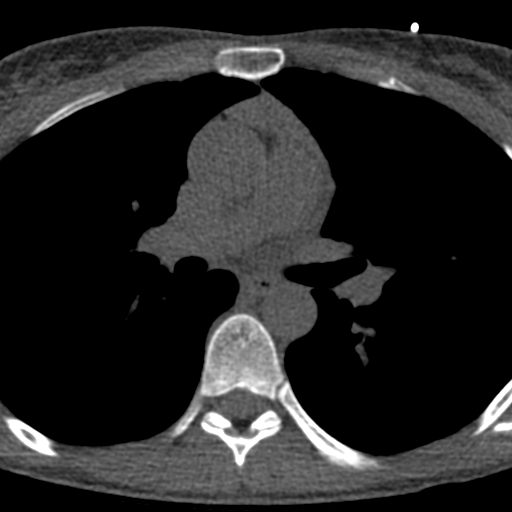
[im 77/86  vessel]
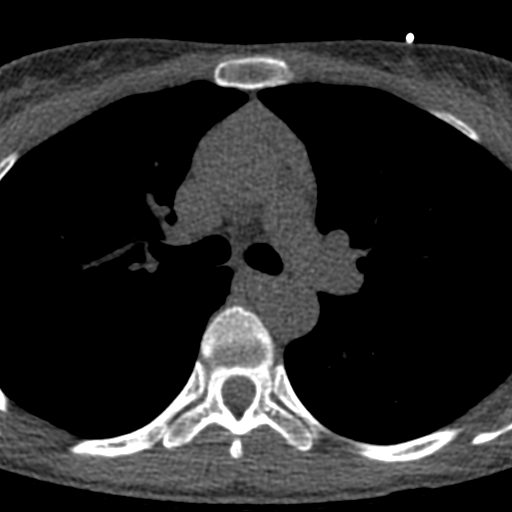

[Series 6: ax st full fov · axial · 0.80mm/px · z∈[+1078,+1192]mm · 7 of 77 slices shown, 9 images]
[im 10/77  vessel]
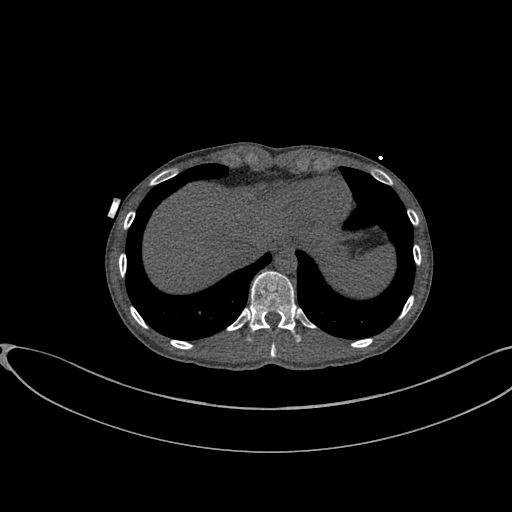
[im 10/77  lung]
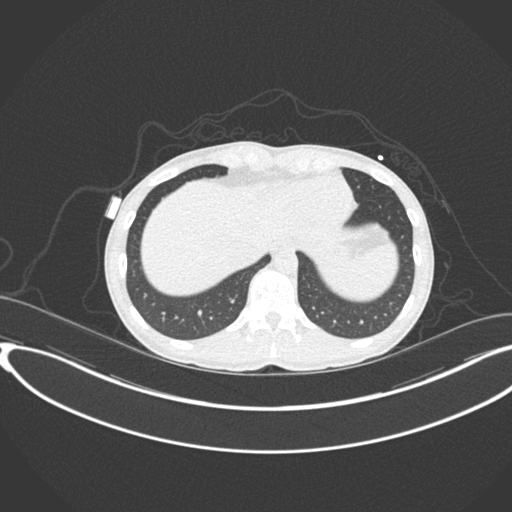
[im 20/77  vessel]
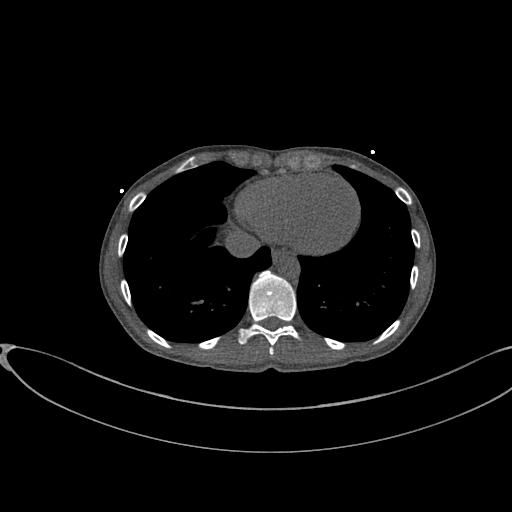
[im 29/77  vessel]
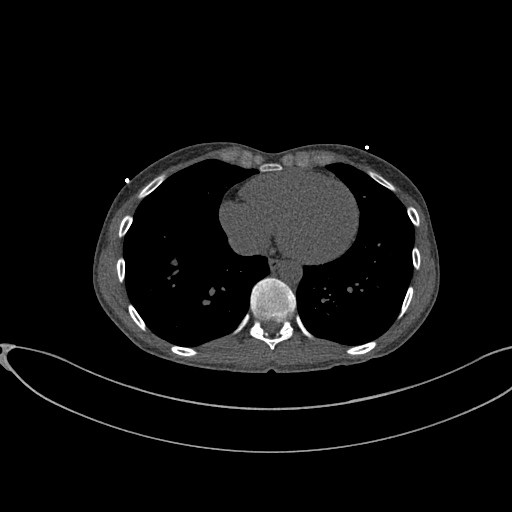
[im 39/77  vessel]
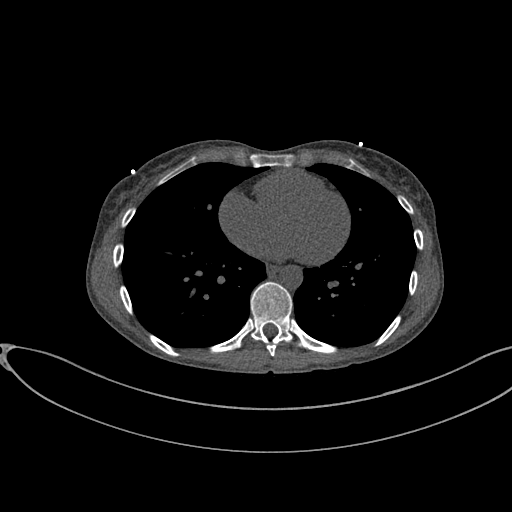
[im 48/77  vessel]
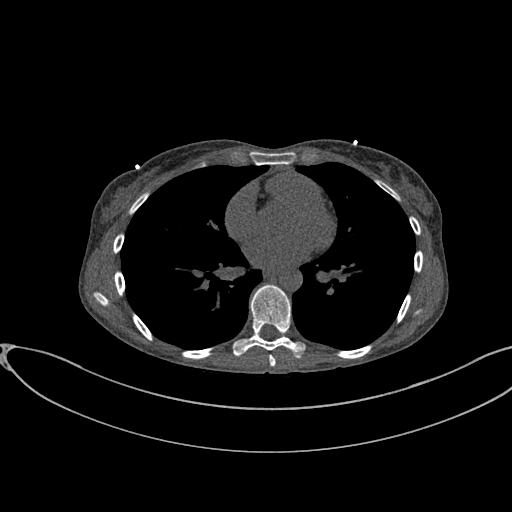
[im 48/77  lung]
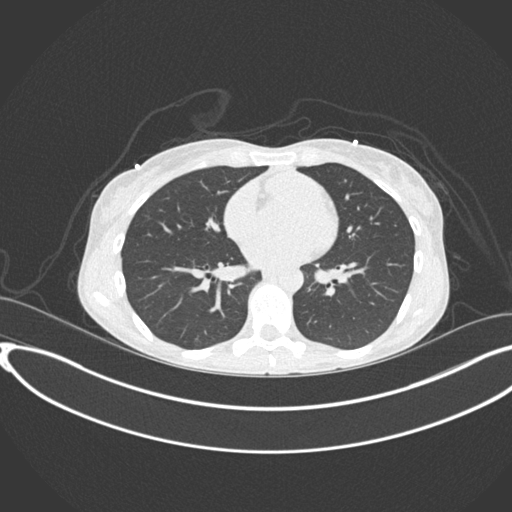
[im 58/77  vessel]
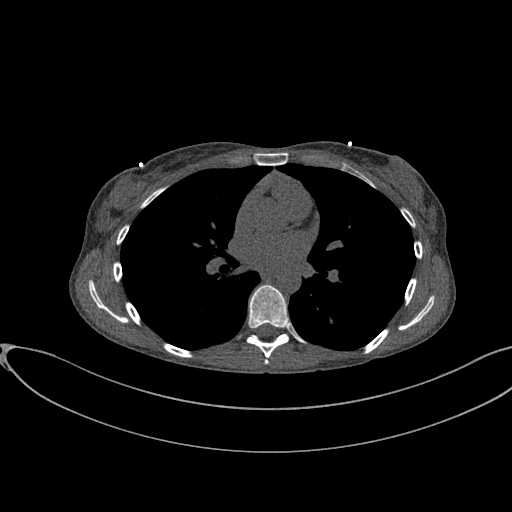
[im 67/77  vessel]
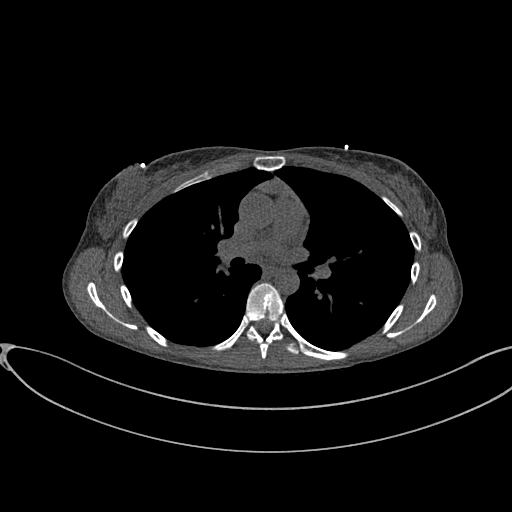

[15 of 20 positions shown; findings below may reference images not displayed]

FINDINGS: Within the visualized portions of the thorax there are no suspicious
appearing pulmonary nodules or masses, there is no acute
consolidative airspace disease, no pleural effusions, no
pneumothorax and no lymphadenopathy. Visualized portions of the
upper abdomen are unremarkable. There are no aggressive appearing
lytic or blastic lesions noted in the visualized portions of the
skeleton.
IMPRESSION: 1. No significant incidental noncardiac findings are noted.
FINDINGS: Non-cardiac: See separate report from [REDACTED].

Ascending aorta: Normal diameter 2.9 cm

Pericardium: Normal

Coronary arteries: No calcium noted
IMPRESSION: Coronary calcium score of 0.

[facility]ss[facility] Nce

*** End of Addendum ***
EXAM:
OVER-READ INTERPRETATION  CT CHEST

The following report is an over-read performed by radiologist Dr.
Henning Juhl Janerka [REDACTED] on 10/27/2020. This
over-read does not include interpretation of cardiac or coronary
anatomy or pathology. The coronary calcium score interpretation by
the cardiologist is attached.
FINDINGS: Within the visualized portions of the thorax there are no suspicious
appearing pulmonary nodules or masses, there is no acute
consolidative airspace disease, no pleural effusions, no
pneumothorax and no lymphadenopathy. Visualized portions of the
upper abdomen are unremarkable. There are no aggressive appearing
lytic or blastic lesions noted in the visualized portions of the
skeleton.
IMPRESSION: 1. No significant incidental noncardiac findings are noted.

## 2023-02-01 ENCOUNTER — Other Ambulatory Visit: Payer: Self-pay | Admitting: Oncology

## 2023-02-01 DIAGNOSIS — Z006 Encounter for examination for normal comparison and control in clinical research program: Secondary | ICD-10-CM

## 2023-04-19 ENCOUNTER — Other Ambulatory Visit (HOSPITAL_COMMUNITY): Payer: Self-pay

## 2023-04-19 MED ORDER — COVID-19 MRNA VAC-TRIS(PFIZER) 30 MCG/0.3ML IM SUSY
0.3000 mL | PREFILLED_SYRINGE | Freq: Once | INTRAMUSCULAR | 0 refills | Status: AC
  Filled 2023-04-19: qty 0.3, 1d supply, fill #0

## 2023-04-19 MED ORDER — INFLUENZA VIRUS VACC SPLIT PF (FLUZONE) 0.5 ML IM SUSY
0.5000 mL | PREFILLED_SYRINGE | Freq: Once | INTRAMUSCULAR | 0 refills | Status: AC
  Filled 2023-04-19: qty 0.5, 1d supply, fill #0

## 2024-05-22 ENCOUNTER — Other Ambulatory Visit: Payer: Self-pay | Admitting: Medical Genetics

## 2024-05-22 DIAGNOSIS — Z006 Encounter for examination for normal comparison and control in clinical research program: Secondary | ICD-10-CM

## 2024-07-16 LAB — GENECONNECT MOLECULAR SCREEN: Genetic Analysis Overall Interpretation: NEGATIVE
